# Patient Record
Sex: Male | Born: 1990 | Race: White | Hispanic: No | Marital: Single | Smoking: Former smoker
Health system: Southern US, Community
[De-identification: ages and names within clinical notes are randomized; demographics above are authoritative.]

## PROBLEM LIST (undated history)

## (undated) HISTORY — PX: TYMPANOSTOMY TUBE PLACEMENT: SHX32

---
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
## 2012-02-01 ENCOUNTER — Emergency Department: Payer: Self-pay | Admitting: Emergency Medicine

## 2014-10-07 ENCOUNTER — Emergency Department: Payer: Self-pay | Admitting: Emergency Medicine

## 2016-02-29 ENCOUNTER — Emergency Department: Payer: Self-pay

## 2016-02-29 ENCOUNTER — Encounter: Payer: Self-pay | Admitting: Emergency Medicine

## 2016-02-29 ENCOUNTER — Emergency Department
Admission: EM | Admit: 2016-02-29 | Discharge: 2016-02-29 | Disposition: A | Discharge status: Home / Self Care | Payer: Self-pay | Attending: Emergency Medicine | Admitting: Emergency Medicine

## 2016-02-29 DIAGNOSIS — Y999 Unspecified external cause status: Secondary | ICD-10-CM | POA: Insufficient documentation

## 2016-02-29 DIAGNOSIS — Y929 Unspecified place or not applicable: Secondary | ICD-10-CM | POA: Insufficient documentation

## 2016-02-29 DIAGNOSIS — F1290 Cannabis use, unspecified, uncomplicated: Secondary | ICD-10-CM | POA: Insufficient documentation

## 2016-02-29 DIAGNOSIS — X501XXA Overexertion from prolonged static or awkward postures, initial encounter: Secondary | ICD-10-CM | POA: Insufficient documentation

## 2016-02-29 DIAGNOSIS — Z87891 Personal history of nicotine dependence: Secondary | ICD-10-CM | POA: Insufficient documentation

## 2016-02-29 DIAGNOSIS — Y9389 Activity, other specified: Secondary | ICD-10-CM | POA: Insufficient documentation

## 2016-02-29 DIAGNOSIS — S93402A Sprain of unspecified ligament of left ankle, initial encounter: Secondary | ICD-10-CM | POA: Insufficient documentation

## 2016-02-29 MED ORDER — IBUPROFEN 600 MG PO TABS: 600.0000 mg | ORAL_TABLET | Freq: Four times a day (QID) | ORAL | 0 refills | Status: DC | PRN

## 2016-02-29 NOTE — ED Notes (Signed)
Wrapped pt left ankle with ace wrap - educated on usage of crutches and fitted for pt

## 2016-02-29 NOTE — ED Provider Notes (Signed)
[facility] Emergency Department Provider Note  ____________________________________________  Time seen: Approximately 7:59 PM  I have reviewed the triage vital signs and the nursing notes.   HISTORY  Chief Complaint Ankle Pain    HPI Nathaniel Rowe is a 25 y.o. male , NAD, presents to emergency department with several hour history of left ankle pain and swelling. States he stepped off a ladder and rolled his left ankle and heard a pop. States that swelling and bruising about the area began immediately. Has not been able to bear weight on the left ankle due to pain. Denies any other injuries or traumas in the past to this ankle. Has not noted any open wounds or lacerations. Denies visual changes, chest pain, shortness of breath to calls the injury and states it was mechanical in nature. Denies any numbness, weakness, tingling to the ankle, foot, toes.   History reviewed. No pertinent past medical history.  There are no active problems to display for this patient.   History reviewed. No pertinent surgical history.  Prior to Admission medications   Medication Sig Start Date End Date Taking? Authorizing Provider  ibuprofen (ADVIL,MOTRIN) 600 MG tablet Take 1 tablet (600 mg total) by mouth every 6 (six) hours as needed. 02/29/16   Jami L Hagler, PA-C    Allergies Review of patient's allergies indicates no known allergies.  No family history on file.  Social History Social History  Substance Use Topics  . Smoking status: Former Smoker    Types: Cigarettes    Quit date: 02/25/2016  . Smokeless tobacco: Not on file  . Alcohol use No     Review of Systems  Constitutional: No fever/chills Eyes: No visual changes.  Cardiovascular: No chest pain. Respiratory: No shortness of breath.  Musculoskeletal: Positive left ankle pain. Negative for left lower leg, foot, toe.  Skin: Positive left ankle swelling and bruising. Negative for rash, redness, warmth,  open wounds or lacerations. Neurological: Negative for headaches, focal weakness or numbness. No tingling 10-point ROS otherwise negative.  ____________________________________________   PHYSICAL EXAM:  VITAL SIGNS: ED Triage Vitals  Enc Vitals Group     BP 02/29/16 1854 (!) 145/67     Pulse Rate 02/29/16 1854 (!) 103     Resp 02/29/16 1854 16     Temp 02/29/16 1854 98.5 F (36.9 C)     Temp Source 02/29/16 1854 Oral     SpO2 02/29/16 1854 98 %     Weight 02/29/16 1855 225 lb (102.1 kg)     Height 02/29/16 1855  (1.88 m)     Head Circumference --      Peak Flow --      Pain Score 02/29/16 1908 10     Pain Loc --      Pain Edu? --      Excl. in GC? --      Constitutional: Alert and oriented. Well appearing and in no acute distress. Eyes: Conjunctivae are normal.  Head: Atraumatic. Cardiovascular: Good peripheral circulation with 2+ pulses noted in the left lower extremity. Capillary refill is brisk in all digits of the left foot. Respiratory: Normal respiratory effort without tachypnea or retractions.  Musculoskeletal: Tenderness to palpation about the left lateral ankle. No laxity with anterior or posterior 4. No laxity with varus or valgus stress. Full range of motion of all digits of the left foot noted. No lower extremity tenderness nor edema.  No joint effusions. Neurologic:  Normal speech and language. No  gross focal neurologic deficits are appreciated.  Skin:  Lateral left ankle with moderate swelling and blue ecchymosis. No open wounds or lesions. Skin is warm, dry and intact. No rash noted. Psychiatric: Mood and affect are normal. Speech and behavior are normal. Patient exhibits appropriate insight and judgement.   ____________________________________________   LABS  None ____________________________________________  EKG  None ____________________________________________  RADIOLOGY I have personally viewed and evaluated these images (plain  radiographs) as part of my medical decision making, as well as reviewing the written report by the radiologist.  Dg Ankle Complete Left  Result Date: 02/29/2016 CLINICAL DATA:  Twisting injury left ankle getting off a ladder today. The patient heard a pop. Initial encounter. EXAM: LEFT ANKLE COMPLETE - 3+ VIEW COMPARISON:  None. FINDINGS: There is some soft tissue swelling about the lateral malleolus. No underlying fracture. Small tibiotalar joint effusion is identified. IMPRESSION: Lateral soft tissue swelling without underlying fracture. Electronically Signed   By: Drusilla Kannerhomas  Dalessio M.D.   On: 02/29/2016 19:38    ____________________________________________    PROCEDURES  Procedure(s) performed: None   Procedures   Medications - No data to display   ____________________________________________   INITIAL IMPRESSION / ASSESSMENT AND PLAN / ED COURSE  Pertinent labs & imaging results that were available during my care of the patient were reviewed by me and considered in my medical decision making (see chart for details).  Clinical Course    Patient's diagnosis is consistent with Left ankle sprain. Patient was placed in an Ace wrap and given crutches for supportive care. Patient given a work note to restrict weightbearing on the left foot for one week but then may return to work without restrictions. Patient will be discharged home with prescriptions for ibuprofen to take as directed. Patient should apply ice to the affected area 20 minutes 3-4 times daily and keep it elevated when not ambulating. Patient is to follow up with Dr. Rosita KeaMenz in orthopedics in 1 week if symptoms persist past this treatment course. Patient is given ED precautions to return to the ED for any worsening or new symptoms.    ____________________________________________  FINAL CLINICAL IMPRESSION(S) / ED DIAGNOSES  Final diagnoses:  Left ankle sprain, initial encounter      NEW MEDICATIONS STARTED DURING  THIS VISIT:  Discharge Medication List as of 02/29/2016  8:02 PM    START taking these medications   Details  ibuprofen (ADVIL,MOTRIN) 600 MG tablet Take 1 tablet (600 mg total) by mouth every 6 (six) hours as needed., Starting Thu 02/29/2016, Print             Hope PigeonJami L Hagler, PA-C 02/29/16 2050    Jeanmarie PlantJames A McShane, MD 02/29/16 2215

## 2016-02-29 NOTE — ED Notes (Addendum)
Pt reports he "rolled" his left ankle at 530pm - pt is not able to bear weight on ankle - outer ankle is swollen - pedal pulses present - pt took 2 tylenol at 535pm and immediately applied ice to the area

## 2016-02-29 NOTE — ED Triage Notes (Signed)
Pt presents to ED with painful left ankle after he stepped off a ladder and rolled his ankle. Heard a pop. Swelling noted.

## 2016-03-18 ENCOUNTER — Emergency Department
Admission: EM | Admit: 2016-03-18 | Discharge: 2016-03-18 | Disposition: A | Discharge status: Home / Self Care | Payer: Self-pay | Attending: Emergency Medicine | Admitting: Emergency Medicine

## 2016-03-18 ENCOUNTER — Emergency Department: Payer: Self-pay

## 2016-03-18 DIAGNOSIS — Z87891 Personal history of nicotine dependence: Secondary | ICD-10-CM | POA: Insufficient documentation

## 2016-03-18 DIAGNOSIS — N200 Calculus of kidney: Secondary | ICD-10-CM | POA: Insufficient documentation

## 2016-03-18 LAB — URINALYSIS COMPLETE WITH MICROSCOPIC (ARMC ONLY)
BILIRUBIN URINE: NEGATIVE
Bacteria, UA: NONE SEEN
GLUCOSE, UA: NEGATIVE mg/dL
KETONES UR: NEGATIVE mg/dL
LEUKOCYTES UA: NEGATIVE
NITRITE: NEGATIVE
PH: 6 (ref 5.0–8.0)
Protein, ur: 30 mg/dL — AB
SQUAMOUS EPITHELIAL / LPF: NONE SEEN
Specific Gravity, Urine: 1.019 (ref 1.005–1.030)

## 2016-03-18 LAB — CBC
HEMATOCRIT: 49.8 % (ref 40.0–52.0)
HEMOGLOBIN: 17.5 g/dL (ref 13.0–18.0)
MCH: 30.9 pg (ref 26.0–34.0)
MCHC: 35.2 g/dL (ref 32.0–36.0)
MCV: 87.7 fL (ref 80.0–100.0)
Platelets: 239 10*3/uL (ref 150–440)
RBC: 5.68 MIL/uL (ref 4.40–5.90)
RDW: 12.8 % (ref 11.5–14.5)
WBC: 6.9 10*3/uL (ref 3.8–10.6)

## 2016-03-18 LAB — BASIC METABOLIC PANEL
ANION GAP: 8 (ref 5–15)
BUN: 19 mg/dL (ref 6–20)
CALCIUM: 9.5 mg/dL (ref 8.9–10.3)
CHLORIDE: 107 mmol/L (ref 101–111)
CO2: 25 mmol/L (ref 22–32)
Creatinine, Ser: 1.16 mg/dL (ref 0.61–1.24)
GFR calc Af Amer: >60 mL/min (ref >60)
GFR calc non Af Amer: >60 mL/min (ref >60)
GLUCOSE: 110 mg/dL — AB (ref 65–99)
POTASSIUM: 4 mmol/L (ref 3.5–5.1)
Sodium: 140 mmol/L (ref 135–145)

## 2016-03-18 MED ORDER — KETOROLAC TROMETHAMINE 30 MG/ML IJ SOLN
30.0000 mg | Freq: Once | INTRAMUSCULAR | Status: AC
  Administered 2016-03-18: 30 mg via INTRAVENOUS
  Filled 2016-03-18: qty 1

## 2016-03-18 MED ORDER — TAMSULOSIN HCL 0.4 MG PO CAPS: 0.4000 mg | ORAL_CAPSULE | Freq: Every day | ORAL | 0 refills | Status: AC

## 2016-03-18 MED ORDER — OXYCODONE-ACETAMINOPHEN 5-325 MG PO TABS
2.0000 | ORAL_TABLET | Freq: Once | ORAL | Status: AC
  Administered 2016-03-18: 2 via ORAL

## 2016-03-18 MED ORDER — SODIUM CHLORIDE 0.9 % IV BOLUS (SEPSIS)
1000.0000 mL | Freq: Once | INTRAVENOUS | Status: AC
  Administered 2016-03-18: 1000 mL via INTRAVENOUS

## 2016-03-18 MED ORDER — FENTANYL CITRATE (PF) 100 MCG/2ML IJ SOLN
INTRAMUSCULAR | Status: AC
  Administered 2016-03-18: 50 ug via NASAL
  Filled 2016-03-18: qty 2

## 2016-03-18 MED ORDER — IBUPROFEN 800 MG PO TABS: 800.0000 mg | ORAL_TABLET | Freq: Three times a day (TID) | ORAL | 0 refills | Status: DC | PRN

## 2016-03-18 MED ORDER — OXYCODONE-ACETAMINOPHEN 5-325 MG PO TABS
ORAL_TABLET | ORAL | Status: AC
  Administered 2016-03-18: 2 via ORAL
  Filled 2016-03-18: qty 2

## 2016-03-18 MED ORDER — ONDANSETRON HCL 4 MG/2ML IJ SOLN
4.0000 mg | Freq: Once | INTRAMUSCULAR | Status: AC
  Administered 2016-03-18: 4 mg via INTRAVENOUS
  Filled 2016-03-18: qty 2

## 2016-03-18 MED ORDER — ONDANSETRON HCL 4 MG/2ML IJ SOLN
INTRAMUSCULAR | Status: AC
  Administered 2016-03-18: 4 mg via INTRAVENOUS
  Filled 2016-03-18: qty 2

## 2016-03-18 MED ORDER — ONDANSETRON HCL 4 MG PO TABS: 4.0000 mg | ORAL_TABLET | Freq: Three times a day (TID) | ORAL | 1 refills | Status: DC | PRN

## 2016-03-18 MED ORDER — OXYCODONE-ACETAMINOPHEN 5-325 MG PO TABS: 2.0000 | ORAL_TABLET | Freq: Four times a day (QID) | ORAL | 0 refills | Status: DC | PRN

## 2016-03-18 MED ORDER — FENTANYL CITRATE (PF) 100 MCG/2ML IJ SOLN
50.0000 ug | INTRAMUSCULAR | Status: DC | PRN
Start: 2016-03-18 — End: 2016-03-18
  Administered 2016-03-18: 50 ug via NASAL

## 2016-03-18 MED ORDER — ONDANSETRON HCL 4 MG/2ML IJ SOLN
4.0000 mg | Freq: Once | INTRAMUSCULAR | Status: AC
  Administered 2016-03-18: 4 mg via INTRAVENOUS

## 2016-03-18 NOTE — ED Triage Notes (Signed)
Pt c/o sudden onset left flank pain with N/V diaphoresis that started this morning.. Denies a hx of kidney stones.

## 2016-03-18 NOTE — ED Provider Notes (Addendum)
[facility]-EMERGENCY DEPARTMENT Provider Note   CSN: [id number] Arrival date & time: 03/18/16  1478     History   Chief Complaint Chief Complaint  Patient presents with  . Flank Pain    HPI Nathaniel Rowe is a 25 y.o. male otherwise healthy here with L flank pain. Acute onset of left flank pain that is sharp and radiates to the left groin this morning. Associated with some diaphoresis and vomiting. Denies any fevers or chills. States that his urine appears a little dark but denies any obvious hematuria or dysuria. He states that he has no history of kidney stones or family history of kidney stones. Denies any testicular pain.  The history is provided by the patient.    History reviewed. No pertinent past medical history.  There are no active problems to display for this patient.   History reviewed. No pertinent surgical history.     Home Medications    Prior to Admission medications   Medication Sig Start Date End Date Taking? Authorizing Provider  ibuprofen (ADVIL,MOTRIN) 600 MG tablet Take 1 tablet (600 mg total) by mouth every 6 (six) hours as needed. 02/29/16   Jami L Hagler, PA-C    Family History No family history on file.  Social History Social History  Substance Use Topics  . Smoking status: Former Smoker    Types: Cigarettes    Quit date: 02/25/2016  . Smokeless tobacco: Never Used  . Alcohol use No     Allergies   Review of patient's allergies indicates no known allergies.   Review of Systems Review of Systems  Genitourinary: Positive for flank pain.  All other systems reviewed and are negative.    Physical Exam Updated Vital Signs BP 138/73 (BP Location: Left Arm)   Pulse 60   Temp 97.7 F (36.5 C) (Oral)   Resp 17   Ht 6\' 2"  (1.88 m)   Wt 225 lb (102.1 kg)   SpO2 99%   BMI 28.89 kg/m   Physical Exam  Constitutional: He is oriented to person, place, and time.  Uncomfortable   HENT:  Head: Normocephalic.  MM slightly dry   Eyes:  EOM are normal. Pupils are equal, round, and reactive to light.  Neck: Normal range of motion. Neck supple.  Cardiovascular: Normal rate, regular rhythm and normal heart sounds.   Pulmonary/Chest: Effort normal and breath sounds normal.  Abdominal: Soft. Bowel sounds are normal.  Mild LLQ tenderness, no rebound   Genitourinary:  Genitourinary Comments: Testicles nontender. Good cremasteric reflex bilaterally   Musculoskeletal: Normal range of motion.  Neurological: He is alert and oriented to person, place, and time.  Skin: Skin is warm.  Psychiatric: He has a normal mood and affect.  Nursing note and vitals reviewed.    ED Treatments / Results  Labs (all labs ordered are listed, but only abnormal results are displayed) Labs Reviewed  URINALYSIS COMPLETEWITH MICROSCOPIC ([facility] ONLY) - Abnormal; Notable for the following:       Result Value   Color, Urine YELLOW (*)    APPearance CLEAR (*)    Hgb urine dipstick 3+ (*)    Protein, ur 30 (*)    All other components within normal limits  BASIC METABOLIC PANEL - Abnormal; Notable for the following:    Glucose, Bld 110 (*)    All other components within normal limits  CBC    EKG  EKG Interpretation None       Radiology Ct Renal Stone Study  Result  Date: 03/18/2016 CLINICAL DATA:  Left flank pain.  Nausea and vomiting.  Diaphoresis. EXAM: CT ABDOMEN AND PELVIS WITHOUT CONTRAST TECHNIQUE: Multidetector CT imaging of the abdomen and pelvis was performed following the standard protocol without IV contrast. COMPARISON:  None. FINDINGS: Lower chest: No significant pulmonary nodules or acute consolidative airspace disease. Hepatobiliary: Normal liver with no liver mass. Normal gallbladder with no radiopaque cholelithiasis. No biliary ductal dilatation. Pancreas: Normal, with no mass or duct dilation. Spleen: Normal size. No mass. Adrenals/Urinary Tract: Normal adrenals. No right renal stones. No right hydronephrosis. Normal caliber  right ureter, with no right ureteral stones. Obstructing 3 mm stone at the left ureterovesical junction with mild left hydroureteronephrosis. There is fat stranding surrounding the central left kidney/collecting system. Punctate nonobstructing 1 mm stone in the interpolar left kidney. No additional left renal stones. No contour deforming renal mass. Collapsed and grossly normal bladder. Stomach/Bowel: Grossly normal stomach. Normal caliber small bowel with no small bowel wall thickening. Normal appendix. Normal large bowel with no diverticulosis, large bowel wall thickening or pericolonic fat stranding. Vascular/Lymphatic: Normal caliber abdominal aorta. No pathologically enlarged lymph nodes in the abdomen or pelvis. Reproductive: Normal size prostate. Other: No pneumoperitoneum, ascites or focal fluid collection. Musculoskeletal: No aggressive appearing focal osseous lesions. Minimal lumbar spondylosis. IMPRESSION: 1. Obstructing 3 mm stone at the left ureterovesical junction with mild left hydroureteronephrosis. 2. Additional punctate nonobstructing left renal stone. 3. Otherwise normal CT of the abdomen and pelvis. Electronically Signed   By: Delbert PhenixJason A Poff M.D.   On: 03/18/2016 12:23    Procedures Procedures (including critical care time)  Medications Ordered in ED Medications  fentaNYL (SUBLIMAZE) injection 50 mcg (50 mcg Nasal Given 03/18/16 0956)  sodium chloride 0.9 % bolus 1,000 mL (0 mLs Intravenous Stopped 03/18/16 1243)  ondansetron (ZOFRAN) injection 4 mg (4 mg Intravenous Given 03/18/16 0956)  ketorolac (TORADOL) 30 MG/ML injection 30 mg (30 mg Intravenous Given 03/18/16 1036)  sodium chloride 0.9 % bolus 1,000 mL (1,000 mLs Intravenous New Bag/Given 03/18/16 1242)  ondansetron (ZOFRAN) injection 4 mg (4 mg Intravenous Given 03/18/16 1241)  oxyCODONE-acetaminophen (PERCOCET/ROXICET) 5-325 MG per tablet 2 tablet (2 tablets Oral Given 03/18/16 1351)     Initial Impression / Assessment and Plan  / ED Course  I have reviewed the triage vital signs and the nursing notes.  Pertinent labs & imaging results that were available during my care of the patient were reviewed by me and considered in my medical decision making (see chart for details).  Clinical Course    Lottie RaterDustin L Thelander is a 25 y.o. male here with L flank pain. Concerned for renal colic. Testicles nontender. Will get labs, UA, CT renal stone.   12:30 pm I reassessed patient. Pain controlled but nauseated and vomited once after CT. Will give more zofran.   2 pm Tolerated PO in the ED. But patient's pain returned. Will give percocet.   2:39 PM Pain controlled, no vomiting now. WBC nl. UA + blood. CT showed 3 mm L UVJ stone with mild hydro. Will dc home with motrin, zofran, percocet, flomax, urology follow up. I checked [place] controlled substance database and he has no recent narcotics filled.    Final Clinical Impressions(s) / ED Diagnoses   Final diagnoses:  None    New Prescriptions New Prescriptions   No medications on file     Charlynne Panderavid Hsienta Kastiel Simonian, MD 03/18/16 1440    Charlynne Panderavid Hsienta Jaley Yan, MD 03/18/16 [phone]

## 2016-03-18 NOTE — Discharge Instructions (Signed)
Stay hydrated.   Take zofran for nausea.   Take motrin for pain.   Take flomax daily.   Take percocet for severe pain. Do NOT drive with it.   See urology for follow up  Return to ER if you have worse abdominal pain, vomiting, fever, dehydration.   Return to

## 2016-03-20 ENCOUNTER — Emergency Department
Admission: EM | Admit: 2016-03-20 | Discharge: 2016-03-20 | Disposition: A | Discharge status: Home / Self Care | Payer: Self-pay | Attending: Emergency Medicine | Admitting: Emergency Medicine

## 2016-03-20 ENCOUNTER — Encounter: Payer: Self-pay | Admitting: Emergency Medicine

## 2016-03-20 DIAGNOSIS — Z87891 Personal history of nicotine dependence: Secondary | ICD-10-CM | POA: Insufficient documentation

## 2016-03-20 DIAGNOSIS — F1290 Cannabis use, unspecified, uncomplicated: Secondary | ICD-10-CM | POA: Insufficient documentation

## 2016-03-20 DIAGNOSIS — N23 Unspecified renal colic: Secondary | ICD-10-CM | POA: Insufficient documentation

## 2016-03-20 DIAGNOSIS — Z791 Long term (current) use of non-steroidal anti-inflammatories (NSAID): Secondary | ICD-10-CM | POA: Insufficient documentation

## 2016-03-20 LAB — URINALYSIS COMPLETE WITH MICROSCOPIC (ARMC ONLY)
Bacteria, UA: NONE SEEN
Bilirubin Urine: NEGATIVE
Glucose, UA: NEGATIVE mg/dL
KETONES UR: NEGATIVE mg/dL
Nitrite: NEGATIVE
PH: 6 (ref 5.0–8.0)
PROTEIN: 30 mg/dL — AB
SPECIFIC GRAVITY, URINE: 1.015 (ref 1.005–1.030)

## 2016-03-20 LAB — BASIC METABOLIC PANEL
ANION GAP: 7 (ref 5–15)
BUN: 18 mg/dL (ref 6–20)
CALCIUM: 9.1 mg/dL (ref 8.9–10.3)
CO2: 27 mmol/L (ref 22–32)
Chloride: 101 mmol/L (ref 101–111)
Creatinine, Ser: 1.2 mg/dL (ref 0.61–1.24)
GFR calc Af Amer: >60 mL/min (ref >60)
GLUCOSE: 98 mg/dL (ref 65–99)
POTASSIUM: 3.8 mmol/L (ref 3.5–5.1)
SODIUM: 135 mmol/L (ref 135–145)

## 2016-03-20 LAB — CBC
HEMATOCRIT: 44.9 % (ref 40.0–52.0)
Hemoglobin: 15.9 g/dL (ref 13.0–18.0)
MCH: 31 pg (ref 26.0–34.0)
MCHC: 35.3 g/dL (ref 32.0–36.0)
MCV: 87.9 fL (ref 80.0–100.0)
PLATELETS: 199 10*3/uL (ref 150–440)
RBC: 5.11 MIL/uL (ref 4.40–5.90)
RDW: 12.8 % (ref 11.5–14.5)
WBC: 12.6 10*3/uL — AB (ref 3.8–10.6)

## 2016-03-20 MED ORDER — KETOROLAC TROMETHAMINE 10 MG PO TABS: 10.0000 mg | ORAL_TABLET | Freq: Three times a day (TID) | ORAL | 0 refills | Status: DC | PRN

## 2016-03-20 MED ORDER — KETOROLAC TROMETHAMINE 60 MG/2ML IM SOLN
INTRAMUSCULAR | Status: AC
  Filled 2016-03-20: qty 2

## 2016-03-20 MED ORDER — ONDANSETRON HCL 4 MG PO TABS: 4.0000 mg | ORAL_TABLET | Freq: Three times a day (TID) | ORAL | 1 refills | Status: AC | PRN

## 2016-03-20 MED ORDER — KETOROLAC TROMETHAMINE 60 MG/2ML IM SOLN
60.0000 mg | Freq: Once | INTRAMUSCULAR | Status: AC
  Administered 2016-03-20: 60 mg via INTRAMUSCULAR

## 2016-03-20 NOTE — ED Provider Notes (Signed)
[facility] Emergency Department Provider Note  ____________________________________________  Time seen: Approximately 4:33 PM  I have reviewed the triage vital signs and the nursing notes.   HISTORY  Chief Complaint Flank Pain    HPI Nathaniel Rowe is a 25 y.o. male with left renal stone presenting for ongoing symptoms. The patient was seen in the emergency department 03/18/16 and was diagnosed with a 3 mm renal stone at the left UVJ. At that time, he was discharged with Percocet, Zofran, and Flomax. He has been taking less Percocet than prescribed because he was concerned he would run out. He states that he is continuing to have pain, although he has no pain when he does not move. The only time he has a "sharp" pain in the left flank that radiates the left lower quadrant is with movement. He denies any hematuria, fever, nausea or vomiting but has had a decreased appetite. He states that he is drinking plenty of water and able to keep down.   History reviewed. No pertinent past medical history.  There are no active problems to display for this patient.   History reviewed. No pertinent surgical history.  Current Outpatient Rx  . Order #: [id number] Class: Print  . Order #: [id number] Class: Print  . Order #: [id number] Class: Print  . Order #: [id number] Class: Print  . Order #: [id number] Class: Print    Allergies Review of patient's allergies indicates no known allergies.  No family history on file.  Social History Social History  Substance Use Topics  . Smoking status: Former Smoker    Types: Cigarettes    Quit date: 02/25/2016  . Smokeless tobacco: Never Used  . Alcohol use No    Review of Systems Constitutional: No fever/chills. Eyes: No visual changes. ENT: No sore throat. No congestion or rhinorrhea. Cardiovascular: Denies chest pain. Denies palpitations. Respiratory: Denies shortness of breath.  No cough. Gastrointestinal:  No nausea, no  vomiting.  No diarrhea.  No constipation.Positive left flank pain radiating to the left lower quadrant. Genitourinary: Negative for dysuria. Musculoskeletal: Negative for back pain. Skin: Negative for rash. Neurological: Negative for headaches. No focal numbness, tingling or weakness.   10-point ROS otherwise negative.  ____________________________________________   PHYSICAL EXAM:  VITAL SIGNS: ED Triage Vitals  Enc Vitals Group     BP 03/20/16 1245 120/63     Pulse Rate 03/20/16 1245 97     Resp 03/20/16 1245 18     Temp 03/20/16 1245 99.3 F (37.4 C)     Temp Source 03/20/16 1245 Oral     SpO2 03/20/16 1245 99 %     Weight 03/20/16 1312 220 lb (99.8 kg)     Height 03/20/16 1312 6\' 2"  (1.88 m)     Head Circumference --      Peak Flow --      Pain Score 03/20/16 1313 10     Pain Loc --      Pain Edu? --      Excl. in GC? --     Constitutional: Alert and oriented. Well appearing and in no acute distress. Answers questions appropriately. Eyes: Conjunctivae are normal.  EOMI. No scleral icterus. Head: Atraumatic. Nose: No congestion/rhinnorhea. Mouth/Throat: Mucous membranes are Mildly dry.  Neck: No stridor.  Supple.   Cardiovascular: Normal rate, regular rhythm. No murmurs, rubs or gallops.  Respiratory: Normal respiratory effort.  No accessory muscle use or retractions. Lungs CTAB.  No wheezes, rales or ronchi. Gastrointestinal: Soft and nondistended.  Positive mild  left CVA tenderness, mild left lower quadrant tenderness to palpation. No guarding or rebound.  No peritoneal signs. Musculoskeletal: No LE edema.  Neurologic:  A&Ox3.  Speech is clear.  Face and smile are symmetric.  EOMI.  Moves all extremities well. Skin:  Skin is warm, dry and intact. No rash noted. Psychiatric: Mood and affect are normal. Speech and behavior are normal.  Normal judgement.  ____________________________________________   LABS (all labs ordered are listed, but only abnormal results are  displayed)  Labs Reviewed  URINALYSIS COMPLETEWITH MICROSCOPIC ([facility] ONLY) - Abnormal; Notable for the following:       Result Value   Color, Urine YELLOW (*)    APPearance CLEAR (*)    Hgb urine dipstick 3+ (*)    Protein, ur 30 (*)    Leukocytes, UA 1+ (*)    Squamous Epithelial / LPF 0-5 (*)    All other components within normal limits  CBC - Abnormal; Notable for the following:    WBC 12.6 (*)    All other components within normal limits  BASIC METABOLIC PANEL   ____________________________________________  EKG  Not indicated ____________________________________________  RADIOLOGY  No results found.  ____________________________________________   PROCEDURES  Procedure(s) performed: None  Procedures  Critical Care performed: No ____________________________________________   INITIAL IMPRESSION / ASSESSMENT AND PLAN / ED COURSE  Pertinent labs & imaging results that were available during my care of the patient were reviewed by me and considered in my medical decision making (see chart for details).  25 y.o. male with known left UVJ stone diagnosed 8/28 presenting with ongoing pain that is only positional, no pain at rest. Overall, the patient is well-appearing and has stable vital signs. He is afebrile here and has not had a fever at home. His examination is consistent with renal colic and his physical exam findings are mild. He has reassuring laboratory studies within normal creatinine, and no indication of infection in his urine. He did bump his white blood cell count at 12.6. Overall, the patient appears to be doing well and conservative management of his stone is indicated. He has not made a follow-up appointment with a urologist, so will give him information to call Dr. Delana Meyer office. In addition, I will provide him with a urinary strainer. At this time, I feel confident that we can transition the patient from narcotics to NSAID medications and I will give him  a prescription for Toradol tablets. He has been instructed about eating with a tablets and precautions regarding taking other simultaneous NSAIDs. Return precautions and follow-up instructions were discussed with the patient and his mother who accompanies him today.  ____________________________________________  FINAL CLINICAL IMPRESSION(S) / ED DIAGNOSES  Final diagnoses:  Renal colic on left side    Clinical Course      NEW MEDICATIONS STARTED DURING THIS VISIT:  New Prescriptions   KETOROLAC (TORADOL) 10 MG TABLET    Take 1 tablet (10 mg total) by mouth every 8 (eight) hours as needed for moderate pain (with food).      Rockne Menghini, MD 03/20/16 [phone]

## 2016-03-20 NOTE — Discharge Instructions (Signed)
Please drink plenty of fluid to stay well-hydrated and to help your kidney stone pass. Please continue to take the Flomax. You may take Tylenol for mild to moderate pain and Toradol for severe pain. Do not take other NSAID medications such as Motrin, ibuprofen, Advil, or Aleve when you're taking Toradol. Please take Toradol with food.  Please strain your urine and bring the kidney stone to your follow-up appointment with the urologist if you catch it. Please call Dr. Delana MeyerBrandon's office to make a follow-up appointment.  Return to the emergency department for severe pain, inability to keep down fluids, fever, or any other symptoms concerning to you.

## 2016-03-20 NOTE — ED Notes (Signed)
Strainer given  

## 2016-03-20 NOTE — ED Triage Notes (Signed)
Left flank pain started Monday.  Seen here in ED and diagnosed with a kidney stone.  Given Oxycodone/Acemaminophen 5/325 and Tamsulosin HCL 0.4 mg.  Patient states he is taking meds as directed with no improvement.  Here today because the pain is unrelieved.

## 2017-05-31 ENCOUNTER — Other Ambulatory Visit: Payer: Self-pay

## 2017-05-31 ENCOUNTER — Emergency Department: Payer: Self-pay

## 2017-05-31 ENCOUNTER — Emergency Department
Admission: EM | Admit: 2017-05-31 | Discharge: 2017-05-31 | Disposition: A | Discharge status: Home / Self Care | Payer: Self-pay | Attending: Emergency Medicine | Admitting: Emergency Medicine

## 2017-05-31 ENCOUNTER — Encounter: Payer: Self-pay | Admitting: Emergency Medicine

## 2017-05-31 DIAGNOSIS — F17210 Nicotine dependence, cigarettes, uncomplicated: Secondary | ICD-10-CM | POA: Insufficient documentation

## 2017-05-31 DIAGNOSIS — M25512 Pain in left shoulder: Secondary | ICD-10-CM | POA: Insufficient documentation

## 2017-05-31 DIAGNOSIS — R52 Pain, unspecified: Secondary | ICD-10-CM

## 2017-05-31 MED ORDER — CYCLOBENZAPRINE HCL 5 MG PO TABS: ORAL_TABLET | ORAL | 0 refills | Status: DC

## 2017-05-31 MED ORDER — KETOROLAC TROMETHAMINE 30 MG/ML IJ SOLN
30.0000 mg | Freq: Once | INTRAMUSCULAR | Status: AC
  Administered 2017-05-31: 30 mg via INTRAMUSCULAR
  Filled 2017-05-31: qty 1

## 2017-05-31 MED ORDER — ORPHENADRINE CITRATE 30 MG/ML IJ SOLN
60.0000 mg | Freq: Two times a day (BID) | INTRAMUSCULAR | Status: DC
  Administered 2017-05-31: 60 mg via INTRAMUSCULAR
  Filled 2017-05-31: qty 2

## 2017-05-31 MED ORDER — IBUPROFEN 800 MG PO TABS: 800.0000 mg | ORAL_TABLET | Freq: Three times a day (TID) | ORAL | 0 refills | Status: DC | PRN
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
## 2017-05-31 NOTE — ED Notes (Signed)
NAD noted at time of D/C. Pt denies questions or concerns. Pt ambulatory to the lobby at this time.  

## 2017-05-31 NOTE — ED Provider Notes (Signed)
[facility] Emergency Department Provider Note  ____________________________________________  Time seen: Approximately 4:28 PM  I have reviewed the triage vital signs and the nursing notes.   HISTORY  Chief Complaint Shoulder Pain    HPI Nathaniel Rowe is a 26 y.o. male that presents to the emergency department for evaluation of left shoulder pain for 1 week.  He states that the pain moves from the front of the shoulder to the back of his shoulder.  He describes the pain as burning in character.  Pain is worse when he moves his arm.  Shoulder is not painful currently.  Patient had an accident 6-7 years ago and injured left shoulder.  No recent trauma.  He was evaluated by a specialist one year ago and was told that he has chips in the bone.  Pain feels the same as it did at that time.  He works at a [employer] and does a lot of "pounding" with his arms.  He took a dose of ibuprofen yesterday.  He denies neck pain, shortness of breath, chest pain, numbness, tingling.  History reviewed. No pertinent past medical history.  There are no active problems to display for this patient.   History reviewed. No pertinent surgical history.  Prior to Admission medications   Medication Sig Start Date End Date Taking? Authorizing Provider  cyclobenzaprine (FLEXERIL) 5 MG tablet Take 1-2 tablets 3 times daily as needed 05/31/17   Enid DerryWagner, Audry Kauzlarich, PA-C  ibuprofen (ADVIL,MOTRIN) 800 MG tablet Take 1 tablet (800 mg total) every 8 (eight) hours as needed by mouth. 05/31/17   Enid DerryWagner, Tempestt Silba, PA-C  ketorolac (TORADOL) 10 MG tablet Take 1 tablet (10 mg total) by mouth every 8 (eight) hours as needed for moderate pain (with food). 03/20/16   Rockne MenghiniNorman, Anne-Caroline, MD  oxyCODONE-acetaminophen (PERCOCET) 5-325 MG tablet Take 2 tablets by mouth every 6 (six) hours as needed. 03/18/16   Charlynne PanderYao, David Hsienta, MD  tamsulosin (FLOMAX) 0.4 MG CAPS capsule Take 1 capsule (0.4 mg total) by mouth  daily. 03/18/16   Charlynne PanderYao, David Hsienta, MD    Allergies Patient has no known allergies.  History reviewed. No pertinent family history.  Social History Social History   Tobacco Use  . Smoking status: Current Every Day Smoker    Packs/day: 0.50    Years: 0.00    Pack years: 0.00    Types: Cigarettes    Last attempt to quit: 02/25/2016    Years since quitting: 1.2  . Smokeless tobacco: Never Used  Substance Use Topics  . Alcohol use: No  . Drug use: Yes    Types: Marijuana     Review of Systems  Cardiovascular: No chest pain. Respiratory:  No SOB. Gastrointestinal: No abdominal pain.  No nausea, no vomiting.  Musculoskeletal: Positive for shoulder pain. Skin: Negative for rash, abrasions, lacerations, ecchymosis. Neurological: Negative for headaches, numbness or tingling   ____________________________________________   PHYSICAL EXAM:  VITAL SIGNS: ED Triage Vitals  Enc Vitals Group     BP 05/31/17 1507 131/69     Pulse Rate 05/31/17 1507 99     Resp 05/31/17 1507 16     Temp 05/31/17 1507 98.8 F (37.1 C)     Temp src --      SpO2 05/31/17 1507 98 %     Weight 05/31/17 1508 230 lb (104.3 kg)     Height 05/31/17 1508 6\' 2"  (1.88 m)     Head Circumference --  Peak Flow --      Pain Score 05/31/17 1507 6     Pain Loc --      Pain Edu? --      Excl. in GC? --      Constitutional: Alert and oriented. Well appearing and in no acute distress. Eyes: Conjunctivae are normal. PERRL. EOMI. Head: Atraumatic. ENT:      Ears:      Nose: No congestion/rhinnorhea.      Mouth/Throat: Mucous membranes are moist.  Neck: No stridor. No cervical spine tenderness to palpation. Cardiovascular: Normal rate, regular rhythm.  Good peripheral circulation.  Symmetric radial pulses. Respiratory: Normal respiratory effort without tachypnea or retractions. Lungs CTAB. Good air entry to the bases with no decreased or absent breath sounds. Musculoskeletal: Full range of motion to  all extremities. No gross deformities appreciated.  No tenderness to palpation over left shoulder.  Full range of motion of shoulder.  Strength 5 out of 5 in upper extremities bilaterally. Neurologic:  Normal speech and language. No gross focal neurologic deficits are appreciated.  Skin:  Skin is warm, dry and intact. No rash noted.   ____________________________________________   LABS (all labs ordered are listed, but only abnormal results are displayed)  Labs Reviewed - No data to display ____________________________________________  EKG   ____________________________________________  RADIOLOGY Lexine BatonI, Lunden Mcleish, personally viewed and evaluated these images (plain radiographs) as part of my medical decision making, as well as reviewing the written report by the radiologist.  Dg Shoulder Left  Result Date: 05/31/2017 CLINICAL DATA:  Worsening pain without trauma. EXAM: LEFT SHOULDER - 2+ VIEW COMPARISON:  None. FINDINGS: There is no evidence of fracture or dislocation. There is no evidence of arthropathy or other focal bone abnormality. Soft tissues are unremarkable. IMPRESSION: Negative. Electronically Signed   By: Gerome Samavid  Williams III M.D   On: 05/31/2017 16:02    ____________________________________________    PROCEDURES  Procedure(s) performed:    Procedures    Medications  orphenadrine (NORFLEX) injection 60 mg (not administered)  ketorolac (TORADOL) 30 MG/ML injection 30 mg (not administered)     ____________________________________________   INITIAL IMPRESSION / ASSESSMENT AND PLAN / ED COURSE  Pertinent labs & imaging results that were available during my care of the patient were reviewed by me and considered in my medical decision making (see chart for details).  Review of the [place] CSRS was performed in accordance of the [facility] prior to dispensing any controlled drugs.   Patient presents to the emergency department for evaluation of left shoulder pain for 1  week.  Vital signs and exam are reassuring.  X-ray negative for bony abnormalities.  Pain is likely musculoskeletal.  He was given IM Norflex and Toradol in ED.  Patient will be discharged home with prescriptions for ibuprofen and Flexeril. Patient is to follow up with PCP as directed. Patient is given ED precautions to return to the ED for any worsening or new symptoms.  ____________________________________________  FINAL CLINICAL IMPRESSION(S) / ED DIAGNOSES  Final diagnoses:  Pain  Left shoulder pain, unspecified chronicity      NEW MEDICATIONS STARTED DURING THIS VISIT:      This chart was dictated using voice recognition software/Dragon. Despite best efforts to proofread, errors can occur which can change the meaning. Any change was purely unintentional.    Enid DerryWagner, Lori Popowski, PA-C 05/31/17 Radonna [id number]    Siadecki, Sebastian, MD 06/01/17 [phone]

## 2017-07-19 ENCOUNTER — Emergency Department
Admission: EM | Admit: 2017-07-19 | Discharge: 2017-07-19 | Disposition: A | Discharge status: Home / Self Care | Payer: Self-pay | Attending: Emergency Medicine | Admitting: Emergency Medicine

## 2017-07-19 ENCOUNTER — Encounter: Payer: Self-pay | Admitting: Emergency Medicine

## 2017-07-19 ENCOUNTER — Other Ambulatory Visit: Payer: Self-pay

## 2017-07-19 DIAGNOSIS — F17210 Nicotine dependence, cigarettes, uncomplicated: Secondary | ICD-10-CM | POA: Insufficient documentation

## 2017-07-19 DIAGNOSIS — Z79899 Other long term (current) drug therapy: Secondary | ICD-10-CM | POA: Insufficient documentation

## 2017-07-19 DIAGNOSIS — H6693 Otitis media, unspecified, bilateral: Secondary | ICD-10-CM | POA: Insufficient documentation

## 2017-07-19 DIAGNOSIS — H6690 Otitis media, unspecified, unspecified ear: Secondary | ICD-10-CM

## 2017-07-19 MED ORDER — AMOXICILLIN-POT CLAVULANATE 875-125 MG PO TABS: 1.0000 | ORAL_TABLET | Freq: Two times a day (BID) | ORAL | 0 refills | Status: AC

## 2017-07-19 NOTE — ED Triage Notes (Signed)
Bilateral ear pain since getting off plane in [place] on 12/27.

## 2017-07-19 NOTE — ED Provider Notes (Signed)
[facility] Emergency Department Provider Note  ____________________________________________  Time seen: Approximately 7:06 PM  I have reviewed the triage vital signs and the nursing notes.   HISTORY  Chief Complaint Otalgia    HPI Nathaniel Rowe is a 26 y.o. male that presents emergency department for evaluation of bilateral ear pain [place] and back here 2 days ago.  There is a significant amount of pain and pressure in both ears.  Patient has tried plugging his nose and blowing, gum, coughing without relief.  No recent illness.  He had tubes in his years as a child.  No fever, nasal congestion, shortness of breath, chest pain.   History reviewed. No pertinent past medical history.  There are no active problems to display for this patient.   History reviewed. No pertinent surgical history.  Prior to Admission medications   Medication Sig Start Date End Date Taking? Authorizing Provider  amoxicillin-clavulanate (AUGMENTIN) 875-125 MG tablet Take 1 tablet by mouth 2 (two) times daily for 10 days. 07/19/17 07/29/17  Enid DerryWagner, Casilda Pickerill, PA-C  cyclobenzaprine (FLEXERIL) 5 MG tablet Take 1-2 tablets 3 times daily as needed 05/31/17   Enid DerryWagner, Maisen Klingler, PA-C  ibuprofen (ADVIL,MOTRIN) 800 MG tablet Take 1 tablet (800 mg total) every 8 (eight) hours as needed by mouth. 05/31/17   Enid DerryWagner, Cheney Gosch, PA-C  ketorolac (TORADOL) 10 MG tablet Take 1 tablet (10 mg total) by mouth every 8 (eight) hours as needed for moderate pain (with food). 03/20/16   Rockne MenghiniNorman, Anne-Caroline, MD  oxyCODONE-acetaminophen (PERCOCET) 5-325 MG tablet Take 2 tablets by mouth every 6 (six) hours as needed. 03/18/16   Charlynne PanderYao, David Hsienta, MD  tamsulosin (FLOMAX) 0.4 MG CAPS capsule Take 1 capsule (0.4 mg total) by mouth daily. 03/18/16   Charlynne PanderYao, David Hsienta, MD    Allergies Patient has no known allergies.  No family history on file.  Social History Social History   Tobacco  Use  . Smoking status: Current Every Day Smoker    Packs/day: 1.00    Years: 0.00    Pack years: 0.00    Types: Cigarettes    Last attempt to quit: 02/25/2016    Years since quitting: 1.3  . Smokeless tobacco: Never Used  Substance Use Topics  . Alcohol use: No  . Drug use: Yes    Types: Marijuana     Review of Systems  Constitutional: No fever/chills Eyes: No visual changes. No discharge. ENT: Negative for congestion and rhinorrhea. Cardiovascular: No chest pain. Respiratory: Negative for cough. No SOB. Musculoskeletal: Negative for musculoskeletal pain. Skin: Negative for rash, abrasions, lacerations, ecchymosis. Neurological: Negative for headaches.   ____________________________________________   PHYSICAL EXAM:  VITAL SIGNS: ED Triage Vitals  Enc Vitals Group     BP 07/19/17 1700 (!) 150/99     Pulse Rate 07/19/17 1700 95     Resp 07/19/17 1700 18     Temp 07/19/17 1700 98 F (36.7 C)     Temp Source 07/19/17 1700 Oral     SpO2 07/19/17 1700 98 %     Weight 07/19/17 1701 220 lb (99.8 kg)     Height 07/19/17 1701 6\' 2"  (1.88 m)     Head Circumference --      Peak Flow --      Pain Score 07/19/17 1700 10     Pain Loc --      Pain Edu? --      Excl. in GC? --  Constitutional: Alert and oriented. Well appearing and in no acute distress. Eyes: Conjunctivae are normal. PERRL. EOMI. No discharge. Head: Atraumatic. ENT: No frontal and maxillary sinus tenderness.      Ears: Tympanic membranes erythematous and bulging bilaterally. No discharge.  No tenderness to palpation of pinna or tragus.  No mastoid tenderness.      Nose: Mild congestion/rhinnorhea.      Mouth/Throat: Mucous membranes are moist. Oropharynx non-erythematous. Tonsils not enlarged. No exudates. Uvula midline. Neck: No stridor.   Hematological/Lymphatic/Immunilogical: No cervical lymphadenopathy. Cardiovascular: Normal rate, regular rhythm.  Good peripheral circulation. Respiratory: Normal  respiratory effort without tachypnea or retractions. Lungs CTAB. Good air entry to the bases with no decreased or absent breath sounds. Gastrointestinal: Bowel sounds 4 quadrants. Soft and nontender to palpation. No guarding or rigidity. No palpable masses. No distention. Musculoskeletal: Full range of motion to all extremities. No gross deformities appreciated. Neurologic:  Normal speech and language. No gross focal neurologic deficits are appreciated.  Skin:  Skin is warm, dry and intact. No rash noted.   ____________________________________________   LABS (all labs ordered are listed, but only abnormal results are displayed)  Labs Reviewed - No data to display ____________________________________________  EKG   ____________________________________________  RADIOLOGY  No results found.  ____________________________________________    PROCEDURES  Procedure(s) performed:    Procedures    Medications - No data to display   ____________________________________________   INITIAL IMPRESSION / ASSESSMENT AND PLAN / ED COURSE  Pertinent labs & imaging results that were available during my care of the patient were reviewed by me and considered in my medical decision making (see chart for details).  Review of the  CSRS was performed in accordance of the [facility] prior to dispensing any controlled drugs.   Patient's diagnosis is consistent with otitis media. Vital signs and exam are reassuring. Patient feels comfortable going home. Patient will be discharged home with prescriptions for Augmentin. Patient is to follow up with PCP as needed or otherwise directed. Patient is given ED precautions to return to the ED for any worsening or new symptoms.   ____________________________________________  FINAL CLINICAL IMPRESSION(S) / ED DIAGNOSES  Final diagnoses:  Acute otitis media, unspecified otitis media type      NEW MEDICATIONS STARTED DURING THIS VISIT:  ED  Discharge Orders        Ordered    amoxicillin-clavulanate (AUGMENTIN) 875-125 MG tablet  2 times daily     07/19/17 1813          This chart was dictated using voice recognition software/Dragon. Despite best efforts to proofread, errors can occur which can change the meaning. Any change was purely unintentional.    Enid DerryWagner, Duncan Alejandro, PA-C 07/19/17 1908    Jeanmarie PlantMcShane, James A, MD 07/19/17 [phone]

## 2017-11-02 ENCOUNTER — Encounter: Payer: Self-pay | Admitting: Emergency Medicine

## 2017-11-02 ENCOUNTER — Emergency Department
Admission: EM | Admit: 2017-11-02 | Discharge: 2017-11-02 | Disposition: A | Discharge status: Home / Self Care | Payer: Self-pay | Attending: Emergency Medicine | Admitting: Emergency Medicine

## 2017-11-02 DIAGNOSIS — F17210 Nicotine dependence, cigarettes, uncomplicated: Secondary | ICD-10-CM | POA: Insufficient documentation

## 2017-11-02 DIAGNOSIS — M7918 Myalgia, other site: Secondary | ICD-10-CM | POA: Insufficient documentation

## 2017-11-02 DIAGNOSIS — M542 Cervicalgia: Secondary | ICD-10-CM | POA: Insufficient documentation

## 2017-11-02 MED ORDER — METHOCARBAMOL 500 MG PO TABS: 500.0000 mg | ORAL_TABLET | Freq: Four times a day (QID) | ORAL | 0 refills | Status: AC

## 2017-11-02 MED ORDER — IBUPROFEN 600 MG PO TABS: 600.0000 mg | ORAL_TABLET | Freq: Three times a day (TID) | ORAL | 0 refills | Status: AC | PRN

## 2017-11-02 MED ORDER — IBUPROFEN 600 MG PO TABS
600.0000 mg | ORAL_TABLET | Freq: Once | ORAL | Status: AC
  Administered 2017-11-02: 600 mg via ORAL
  Filled 2017-11-02: qty 1

## 2017-11-02 MED ORDER — METHOCARBAMOL 500 MG PO TABS
1000.0000 mg | ORAL_TABLET | Freq: Once | ORAL | Status: AC
  Administered 2017-11-02: 1000 mg via ORAL
  Filled 2017-11-02: qty 2

## 2017-11-02 NOTE — ED Notes (Signed)
Pt complains of right sided neck pain, described as a tightness. Pt states he switched pillows a couple days ago before the discomfort started. Pt still maintains full ROM.

## 2017-11-02 NOTE — Discharge Instructions (Addendum)
Follow-up with your primary care provider if any continued problems or concerns.  Use ice or heat to your neck and shoulder as needed for discomfort.  Take ibuprofen and Robaxin as needed for your shoulder neck pain.  Do not take the Robaxin while driving or operating machinery.  You may continue taking ibuprofen every 8 hours with food.

## 2017-11-02 NOTE — ED Notes (Signed)

## 2017-11-02 NOTE — ED Triage Notes (Signed)
Patient presents to the ED with neck, "popping" x 2 days.  Patient states, "it pops no matter which way I turn it."  Patient is in no obvious distress at this time.

## 2017-11-02 NOTE — ED Provider Notes (Signed)
[facility] Emergency Department Provider Note  ____________________________________________   First MD Initiated Contact with Patient 11/02/17 1627     (approximate)  I have reviewed the triage vital signs and the nursing notes.   HISTORY  Chief Complaint Neck Pain  HPI Nathaniel Rowe is a 27 y.o. male is here with complaint of neck pain for the last 2 days.  Patient states that when he moves his neck he hears a "pop".  He also feels stiffness and soreness into his left shoulder.  He states there was no injury.  He states that he slept on a pillow and noticed it the next morning.  Patient has not taken any over-the-counter medication.  He denies any paresthesias into his left arm or decreased grip strength.  Rates his pain as 4 out of 10. History reviewed. No pertinent past medical history.  There are no active problems to display for this patient.   History reviewed. No pertinent surgical history.  Prior to Admission medications   Medication Sig Start Date End Date Taking? Authorizing Provider  ibuprofen (ADVIL,MOTRIN) 600 MG tablet Take 1 tablet (600 mg total) by mouth every 8 (eight) hours as needed. 11/02/17   Tommi RumpsSummers, Anetta Olvera L, PA-C  methocarbamol (ROBAXIN) 500 MG tablet Take 1 tablet (500 mg total) by mouth 4 (four) times daily. 11/02/17   Tommi RumpsSummers, Lelia Jons L, PA-C  tamsulosin (FLOMAX) 0.4 MG CAPS capsule Take 1 capsule (0.4 mg total) by mouth daily. 03/18/16   Charlynne PanderYao, David Hsienta, MD    Allergies Patient has no known allergies.  No family history on file.  Social History Social History   Tobacco Use  . Smoking status: Current Every Day Smoker    Packs/day: 1.00    Years: 0.00    Pack years: 0.00    Types: Cigarettes    Last attempt to quit: 02/25/2016    Years since quitting: 1.6  . Smokeless tobacco: Never Used  Substance Use Topics  . Alcohol use: No  . Drug use: Yes    Types: Marijuana    Review of Systems Constitutional: No  fever/chills Cardiovascular: Denies chest pain. Respiratory: Denies shortness of breath. Musculoskeletal: Positive for cervical discomfort and left shoulder pain. Skin: Negative for rash. Neurological: Negative for  focal weakness or numbness. ____________________________________________   PHYSICAL EXAM:  VITAL SIGNS: ED Triage Vitals  Enc Vitals Group     BP 11/02/17 1555 (!) 143/73     Pulse Rate 11/02/17 1555 71     Resp 11/02/17 1555 18     Temp 11/02/17 1555 98.4 F (36.9 C)     Temp Source 11/02/17 1555 Oral     SpO2 11/02/17 1555 98 %     Weight 11/02/17 1551 230 lb (104.3 kg)     Height 11/02/17 1551 6\' 2"  (1.88 m)     Head Circumference --      Peak Flow --      Pain Score 11/02/17 1551 4     Pain Loc --      Pain Edu? --      Excl. in GC? --     Constitutional: Alert and oriented. Well appearing and in no acute distress. Eyes: Conjunctivae are normal. PERRL. EOMI. Head: Atraumatic. Neck: No stridor.  No point tenderness on palpation of cervical spine posteriorly.  There is some minimal soft tissue tenderness noted to the left cervical muscles and trapezius.  There is no gross deformity or soft tissue swelling.  There is  no evidence of injury.  Range of motion is without restriction in all 4 planes.  No crepitus is noted with range of motion of the left shoulder.  Nontender scapula.  Muscles are moderately tender and tight in comparison with the right. Hematological/Lymphatic/Immunilogical: No cervical lymphadenopathy. Cardiovascular: Normal rate, regular rhythm. Grossly normal heart sounds.  Good peripheral circulation. Respiratory: Normal respiratory effort.  No retractions. Lungs CTAB. Gastrointestinal: Soft and nontender. No distention. No abdominal bruits. No CVA tenderness. Musculoskeletal: Moves upper and lower extremities without any difficulty and normal gait was noted.  See cervical exam above.  Grip strength was equal bilaterally. Neurologic:  Normal speech  and language. No gross focal neurologic deficits are appreciated. No gait instability. Skin:  Skin is warm, dry and intact.  No ecchymosis, erythema or abrasions were noted. Psychiatric: Mood and affect are normal. Speech and behavior are normal.  ____________________________________________   LABS (all labs ordered are listed, but only abnormal results are displayed)  Labs Reviewed - No data to display  RADIOLOGY Deferred.  PROCEDURES  Procedure(s) performed: None  Procedures  Critical Care performed: No  ____________________________________________   INITIAL IMPRESSION / ASSESSMENT AND PLAN / ED COURSE Patient presents with cervical discomfort for 2 days.  There is no history of injury.  Patient exam shows muscle tenderness only on exam.  Because he is not driving patient was given methocarbamol and ibuprofen while in the department.  He was discharged with prescription for the same.  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  He is also encouraged to use ice or heat to his neck as needed and follow-up with his PCP if any continued problems.  ____________________________________________   FINAL CLINICAL IMPRESSION(S) / ED DIAGNOSES  Final diagnoses:  Musculoskeletal pain     ED Discharge Orders        Ordered    ibuprofen (ADVIL,MOTRIN) 600 MG tablet  Every 8 hours PRN     11/02/17 1710    methocarbamol (ROBAXIN) 500 MG tablet  4 times daily     11/02/17 1710       Note:  This document was prepared using Dragon voice recognition software and may include unintentional dictation errors.    Tommi Rumps, PA-C 11/02/17 1738    Jene Every, MD 11/02/17 [phone]

## 2018-02-12 ENCOUNTER — Emergency Department: Payer: Self-pay

## 2018-02-12 ENCOUNTER — Emergency Department
Admission: EM | Admit: 2018-02-12 | Discharge: 2018-02-12 | Disposition: A | Discharge status: Home / Self Care | Payer: Self-pay | Attending: Emergency Medicine | Admitting: Emergency Medicine

## 2018-02-12 ENCOUNTER — Other Ambulatory Visit: Payer: Self-pay

## 2018-02-12 ENCOUNTER — Encounter: Payer: Self-pay | Admitting: Emergency Medicine

## 2018-02-12 DIAGNOSIS — F17210 Nicotine dependence, cigarettes, uncomplicated: Secondary | ICD-10-CM | POA: Insufficient documentation

## 2018-02-12 DIAGNOSIS — Z79899 Other long term (current) drug therapy: Secondary | ICD-10-CM | POA: Insufficient documentation

## 2018-02-12 DIAGNOSIS — M25512 Pain in left shoulder: Secondary | ICD-10-CM | POA: Insufficient documentation

## 2018-02-12 MED ORDER — CYCLOBENZAPRINE HCL 10 MG PO TABS: 10.0000 mg | ORAL_TABLET | Freq: Three times a day (TID) | ORAL | 0 refills | Status: DC | PRN

## 2018-02-12 MED ORDER — NAPROXEN 500 MG PO TABS: 500.0000 mg | ORAL_TABLET | Freq: Two times a day (BID) | ORAL | Status: DC

## 2018-02-12 NOTE — ED Notes (Signed)
Left shoulder pain upon waking this am.  [facility] [employer].  Says has had problems with same shoulder couple years ago.  No injury.  Has it in a sling.

## 2018-02-12 NOTE — ED Notes (Signed)
Returned from xray

## 2018-02-12 NOTE — Discharge Instructions (Signed)
Follow discharge care instruction.  Pick up prescription from pharmacy.  Consider orthopedic evaluation due to chronic nature of your complaint.

## 2018-02-12 NOTE — ED Triage Notes (Addendum)
Pt c/o left shoulder, pain has hx of the same, states the pain started about a day ago, states pain worse over night. Pt has arm in sling at this time.  Pt states he took 5 200mg  ibuprofen this morning.

## 2018-02-12 NOTE — ED Provider Notes (Signed)
[facility] Emergency Department Provider Note   ____________________________________________   First MD Initiated Contact with Patient 02/12/18 [phone]     (approximate)  I have reviewed the triage vital signs and the nursing notes.   HISTORY  Chief Complaint Shoulder Pain    HPI Nathaniel Rowe is a 27 y.o. male patient complain of left shoulder pain for 1 day.  Patient denies provocative complaint.  Patient state this ongoing problem secondary to injury he had 8 years ago resulting in the episodic pain.  Last incident occurred approximately 10 months ago.  Patient normally pain is controlled with NSAIDs.  Patient rates pain as a 9/10.  Patient described pain is "aching".  Patient is wearing a sling from previous ED visits.   History reviewed. No pertinent past medical history.  There are no active problems to display for this patient.   History reviewed. No pertinent surgical history.  Prior to Admission medications   Medication Sig Start Date End Date Taking? Authorizing Provider  cyclobenzaprine (FLEXERIL) 10 MG tablet Take 1 tablet (10 mg total) by mouth 3 (three) times daily as needed. 02/12/18   Joni Reining, PA-C  ibuprofen (ADVIL,MOTRIN) 600 MG tablet Take 1 tablet (600 mg total) by mouth every 8 (eight) hours as needed. 11/02/17   Tommi Rumps, PA-C  methocarbamol (ROBAXIN) 500 MG tablet Take 1 tablet (500 mg total) by mouth 4 (four) times daily. 11/02/17   Tommi Rumps, PA-C  naproxen (NAPROSYN) 500 MG tablet Take 1 tablet (500 mg total) by mouth 2 (two) times daily with a meal. 02/12/18   Joni Reining, PA-C  tamsulosin (FLOMAX) 0.4 MG CAPS capsule Take 1 capsule (0.4 mg total) by mouth daily. 03/18/16   Charlynne Pander, MD    Allergies Patient has no known allergies.  History reviewed. No pertinent family history.  Social History Social History   Tobacco Use  . Smoking status: Current Every Day Smoker    Packs/day:  1.00    Years: 0.00    Pack years: 0.00    Types: Cigarettes    Last attempt to quit: 02/25/2016    Years since quitting: 1.9  . Smokeless tobacco: Never Used  Substance Use Topics  . Alcohol use: No  . Drug use: Yes    Types: Marijuana    Review of Systems Constitutional: No fever/chills Eyes: No visual changes. ENT: No sore throat. Cardiovascular: Denies chest pain. Respiratory: Denies shortness of breath. Gastrointestinal: No abdominal pain.  No nausea, no vomiting.  No diarrhea.  No constipation. Genitourinary: Negative for dysuria. Musculoskeletal: Left shoulder pain.. Skin: Negative for rash. Neurological: Negative for headaches, focal weakness or numbness. ____________________________________________   PHYSICAL EXAM:  VITAL SIGNS: ED Triage Vitals  Enc Vitals Group     BP 02/12/18 0738 140/79     Pulse Rate 02/12/18 0738 67     Resp 02/12/18 0738 18     Temp 02/12/18 0738 97.9 F (36.6 C)     Temp Source 02/12/18 0738 Oral     SpO2 02/12/18 0738 99 %     Weight 02/12/18 0740 230 lb (104.3 kg)     Height 02/12/18 0740 6\' 2"  (1.88 m)     Head Circumference --      Peak Flow --      Pain Score 02/12/18 0740 9     Pain Loc --      Pain Edu? --      Excl. in GC? --  Constitutional: Alert and oriented. Well appearing and in no acute distress. Neck: No stridor. Hematological/Lymphatic/Immunilogical: No cervical lymphadenopathy. Cardiovascular: Normal rate, regular rhythm. Grossly normal heart sounds.  Good peripheral circulation. Respiratory: Normal respiratory effort.  No retractions. Lungs CTAB. Musculoskeletal: No obvious deformity to the left shoulder.  [facility].  Decreased range of motion limited by complaint of pain.Marland Kitchen. Neurologic:  Normal speech and language. No gross focal neurologic deficits are appreciated. No gait instability. Skin:  Skin is warm, dry and intact. No rash noted. Psychiatric: Mood and affect are  normal. Speech and behavior are normal.  ____________________________________________   LABS (all labs ordered are listed, but only abnormal results are displayed)  Labs Reviewed - No data to display ____________________________________________  EKG   ____________________________________________  RADIOLOGY  ED MD interpretation:    Official radiology report(s): Dg Shoulder Left  Result Date: 02/12/2018 CLINICAL DATA:  Pt states he woke up this AM with left shoulder pain all over shoulder. Has had issues on and off with this same shoulder for a while but no prev injury or surgery. EXAM: LEFT SHOULDER - 2+ VIEW COMPARISON:  05/31/2017 FINDINGS: There is no evidence of fracture or dislocation. There is no evidence of arthropathy or other focal bone abnormality. Soft tissues are unremarkable. IMPRESSION: Negative. Electronically Signed   By: Amie Portlandavid  Ormond M.D.   On: 02/12/2018 08:14    ____________________________________________   PROCEDURES  Procedure(s) performed: None  Procedures  Critical Care performed: No  ____________________________________________   INITIAL IMPRESSION / ASSESSMENT AND PLAN / ED COURSE  As part of my medical decision making, I reviewed the following data within the electronic MEDICAL RECORD NUMBER    Non-provocative left shoulder pain.  Discussed negative x-ray findings with patient.  Patient given discharge care instruction advised take medication as directed.  Patient advised to follow-up with orthopedic due to the chronic nature of his complaint.      ____________________________________________   FINAL CLINICAL IMPRESSION(S) / ED DIAGNOSES  Final diagnoses:  Acute pain of left shoulder     ED Discharge Orders        Ordered    naproxen (NAPROSYN) 500 MG tablet  2 times daily with meals     02/12/18 0826    cyclobenzaprine (FLEXERIL) 10 MG tablet  3 times daily PRN     02/12/18 [id number]       Note:  This document was prepared using  Dragon voice recognition software and may include unintentional dictation errors.    Joni ReiningSmith, Dody Smartt K, PA-C 02/12/18 [id number]    Sharman CheekStafford, Phillip, MD 02/16/18 [phone]

## 2018-03-04 ENCOUNTER — Emergency Department: Payer: Self-pay

## 2018-03-04 ENCOUNTER — Other Ambulatory Visit: Payer: Self-pay

## 2018-03-04 ENCOUNTER — Encounter: Payer: Self-pay | Admitting: Emergency Medicine

## 2018-03-04 ENCOUNTER — Emergency Department
Admission: EM | Admit: 2018-03-04 | Discharge: 2018-03-04 | Disposition: A | Discharge status: Home / Self Care | Payer: Self-pay | Attending: Emergency Medicine | Admitting: Emergency Medicine

## 2018-03-04 DIAGNOSIS — R531 Weakness: Secondary | ICD-10-CM | POA: Insufficient documentation

## 2018-03-04 DIAGNOSIS — R002 Palpitations: Secondary | ICD-10-CM | POA: Insufficient documentation

## 2018-03-04 DIAGNOSIS — R638 Other symptoms and signs concerning food and fluid intake: Secondary | ICD-10-CM | POA: Insufficient documentation

## 2018-03-04 DIAGNOSIS — Z79899 Other long term (current) drug therapy: Secondary | ICD-10-CM | POA: Insufficient documentation

## 2018-03-04 DIAGNOSIS — F1210 Cannabis abuse, uncomplicated: Secondary | ICD-10-CM | POA: Insufficient documentation

## 2018-03-04 DIAGNOSIS — F17210 Nicotine dependence, cigarettes, uncomplicated: Secondary | ICD-10-CM | POA: Insufficient documentation

## 2018-03-04 LAB — BASIC METABOLIC PANEL
ANION GAP: 8 (ref 5–15)
BUN: 15 mg/dL (ref 6–20)
CHLORIDE: 105 mmol/L (ref 98–111)
CO2: 29 mmol/L (ref 22–32)
Calcium: 9.8 mg/dL (ref 8.9–10.3)
Creatinine, Ser: 0.92 mg/dL (ref 0.61–1.24)
GFR calc non Af Amer: >60 mL/min (ref >60)
Glucose, Bld: 108 mg/dL — ABNORMAL HIGH (ref 70–99)
Potassium: 3.4 mmol/L — ABNORMAL LOW (ref 3.5–5.1)
Sodium: 142 mmol/L (ref 135–145)

## 2018-03-04 LAB — MAGNESIUM: Magnesium: 2 mg/dL (ref 1.7–2.4)

## 2018-03-04 LAB — CBC
HCT: 53.2 % — ABNORMAL HIGH (ref 40.0–52.0)
HEMOGLOBIN: 18.6 g/dL — AB (ref 13.0–18.0)
MCH: 32 pg (ref 26.0–34.0)
MCHC: 34.9 g/dL (ref 32.0–36.0)
MCV: 91.7 fL (ref 80.0–100.0)
Platelets: 223 10*3/uL (ref 150–440)
RBC: 5.81 MIL/uL (ref 4.40–5.90)
RDW: 13.4 % (ref 11.5–14.5)
WBC: 9.8 10*3/uL (ref 3.8–10.6)

## 2018-03-04 LAB — TSH: TSH: 2.842 u[IU]/mL (ref 0.350–4.500)

## 2018-03-04 LAB — TROPONIN I: Troponin I: <0.03 ng/mL (ref <0.03)

## 2018-03-04 MED ORDER — SODIUM CHLORIDE 0.9 % IV BOLUS
1000.0000 mL | Freq: Once | INTRAVENOUS | Status: AC
Start: 2018-03-04 — End: 2018-03-04
  Administered 2018-03-04: 1000 mL via INTRAVENOUS

## 2018-03-04 NOTE — ED Triage Notes (Addendum)
Pt presents to ED with feeling like his "heart is beating faster than it should be for the past couple of days". Pt denies chest pain at this time. Pt states he "just doesn't feel right". Pt alert and appears anxious. Has not been able to eat much today. Denies nausea. No increased work of breathing or acute distress noted. Pt states he "feels perfectly normal when he wakes up in the morning." symptoms worsen throughout the day.

## 2018-03-04 NOTE — ED Provider Notes (Signed)
[facility] Emergency Department Provider Note  ____________________________________________  Time seen: Approximately 10:30 PM  I have reviewed the triage vital signs and the nursing notes.   HISTORY  Chief Complaint Palpitations   HPI Nathaniel Rowe is a 27 y.o. male with a history of bipolar disorder and ADHD who presents for evaluation of palpitations.  Patient reports 2.5 days of constant palpitations.  No chest pain, no shortness of breath.  He reports that the sensation is constant 24 hours a day for the last 2.5 days.  No dizziness.  Patient has had decreased appetite since all of this started.  He is complaining of generalized weakness. No fever, no vomiting, no diarrhea, no abdominal pain.  He smokes cigarettes and marijuana.  He denies drinking alcohol or any other drug use.  No personal history of blood clots, recent travel immobilization, leg pain or swelling, hemoptysis, or exogenous hormones.  Patient denies ever having similar symptoms.  He reports being a little stressed out.  He has an appointment scheduled with his PCP tomorrow however his mother was very concerned since he lives alone and want him to be evaluated.  Past Surgical History:  Procedure Laterality Date  . TYMPANOSTOMY TUBE PLACEMENT      Prior to Admission medications   Medication Sig Start Date End Date Taking? Authorizing Provider  cyclobenzaprine (FLEXERIL) 10 MG tablet Take 1 tablet (10 mg total) by mouth 3 (three) times daily as needed. 02/12/18   Joni ReiningSmith, Ronald K, PA-C  ibuprofen (ADVIL,MOTRIN) 600 MG tablet Take 1 tablet (600 mg total) by mouth every 8 (eight) hours as needed. 11/02/17   Tommi RumpsSummers, Rhonda L, PA-C  methocarbamol (ROBAXIN) 500 MG tablet Take 1 tablet (500 mg total) by mouth 4 (four) times daily. 11/02/17   Tommi RumpsSummers, Rhonda L, PA-C  naproxen (NAPROSYN) 500 MG tablet Take 1 tablet (500 mg total) by mouth 2 (two) times daily with a meal. 02/12/18   Joni ReiningSmith, Ronald K,  PA-C  tamsulosin (FLOMAX) 0.4 MG CAPS capsule Take 1 capsule (0.4 mg total) by mouth daily. 03/18/16   Charlynne PanderYao, David Hsienta, MD    Allergies Patient has no known allergies.  FH Mother - anxiety  Social History Social History   Tobacco Use  . Smoking status: Current Every Day Smoker    Packs/day: 1.00    Years: 0.00    Pack years: 0.00    Types: Cigarettes    Last attempt to quit: 02/25/2016    Years since quitting: 2.0  . Smokeless tobacco: Never Used  Substance Use Topics  . Alcohol use: No  . Drug use: Yes    Types: Marijuana    Review of Systems  Constitutional: Negative for fever. + Generalized Eyes: Negative for visual changes. ENT: Negative for sore throat. Neck: No neck pain  Cardiovascular: Negative for chest pain. + palpitations Respiratory: Negative for shortness of breath. Gastrointestinal: Negative for abdominal pain, vomiting or diarrhea. Genitourinary: Negative for dysuria. Musculoskeletal: Negative for back pain. Skin: Negative for rash. Neurological: Negative for headaches, weakness or numbness. Psych: No SI or HI  ____________________________________________   PHYSICAL EXAM:  VITAL SIGNS: ED Triage Vitals [03/04/18 1932]  Enc Vitals Group     BP (!) 170/95     Pulse Rate 75     Resp 20     Temp (!) 97.5 F (36.4 C)     Temp Source Oral     SpO2 100 %     Weight 220 lb (99.8 kg)  Height 6\' 2"  (1.88 m)     Head Circumference      Peak Flow      Pain Score 0     Pain Loc      Pain Edu?      Excl. in GC?     Constitutional: Alert and oriented. Well appearing and in no apparent distress. HEENT:      Head: Normocephalic and atraumatic.         Eyes: Conjunctivae are normal. Sclera is non-icteric.       Mouth/Throat: Mucous membranes are moist.       Neck: Supple with no signs of meningismus. Cardiovascular: Regular rate and rhythm. No murmurs, gallops, or rubs. 2+ symmetrical distal pulses are present in all extremities. No  JVD. Respiratory: Normal respiratory effort. Lungs are clear to auscultation bilaterally. No wheezes, crackles, or rhonchi.  Gastrointestinal: Soft, non tender, and non distended with positive bowel sounds. No rebound or guarding. Musculoskeletal: Nontender with normal range of motion in all extremities. No edema, cyanosis, or erythema of extremities. Neurologic: Normal speech and language. Face is symmetric. Moving all extremities. No gross focal neurologic deficits are appreciated. Skin: Skin is warm, dry and intact. No rash noted. Psychiatric: Mood and affect are normal. Speech and behavior are normal.  ____________________________________________   LABS (all labs ordered are listed, but only abnormal results are displayed)  Labs Reviewed  BASIC METABOLIC PANEL - Abnormal; Notable for the following components:      Result Value   Potassium 3.4 (*)    Glucose, Bld 108 (*)    All other components within normal limits  CBC - Abnormal; Notable for the following components:   Hemoglobin 18.6 (*)    HCT 53.2 (*)    All other components within normal limits  TROPONIN I  TSH  MAGNESIUM   ____________________________________________  EKG  ED ECG REPORT I, Nita Sicklearolina Shacora Zynda, the attending physician, personally viewed and interpreted this ECG.   Normal sinus rhythm, normal intervals, normal axis, no STE or depressions, no evidence of HOCM, AV block, delta wave, ARVD, prolonged QTc, WPW, or Brugada. No prior available  ____________________________________________  RADIOLOGY  I have personally reviewed the images performed during this visit and I agree with the Radiologist's read.   Interpretation by Radiologist:  Dg Chest 2 View  Result Date: 03/04/2018 CLINICAL DATA:  Palpitations.  Smoker. EXAM: CHEST - 2 VIEW COMPARISON:  None. FINDINGS: Midline trachea.  Normal heart size and mediastinal contours. Sharp costophrenic angles.  No pneumothorax.  Clear lungs. IMPRESSION: No  active cardiopulmonary disease. Electronically Signed   By: Jeronimo GreavesKyle  Talbot M.D.   On: 03/04/2018 20:09    ____________________________________________   PROCEDURES  Procedure(s) performed: None Procedures Critical Care performed:  None ____________________________________________   INITIAL IMPRESSION / ASSESSMENT AND PLAN / ED COURSE   27 y.o. male with a history of bipolar disorder and ADHD who presents for evaluation of constant palpitations for 2.5 days.  Patient is complaining of palpitations at this time while his heart rate is 65, regular, with no PVCs or PACs.  EKG shows no dysrhythmias or ischemia.  Chest x-ray shows no evidence of edema or cardiomegaly, no pneumonia or pneumothorax.  Labs showing normal electrolytes.  Patient looks hemoconcentrated with a hemoglobin of 18.6 and hematocrit of 53.2.  His symptoms could be from dehydration.  Will give IV fluids.  TSH showing no evidence of thyroid dysfunction.  Troponin is negative.  Will monitor on telemetry for any signs of arrhythmia  and reassess after fluids    _________________________ 10:55 PM on 03/04/2018 -----------------------------------------  Patient monitor on telemetry with no evidence of dysrhythmias.  He does report drinking a lot of coffee so I recommended reducing caffeine.  Labs are within normal limits.  Patient feels better after IV fluids.  He has an appointment with his PCP tomorrow in the morning which I recommended that he goes to.  Discussed return precautions for chest pain, new or worsening palpitations, dizziness, otherwise patient will follow-up with his doctor.   As part of my medical decision making, I reviewed the following data within the electronic MEDICAL RECORD NUMBER Nursing notes reviewed and incorporated, Labs reviewed , EKG interpreted , Old chart reviewed, Radiograph reviewed , Notes from prior ED visits and [place] Controlled Substance Database    Pertinent labs & imaging results that were available  during my care of the patient were reviewed by me and considered in my medical decision making (see chart for details).    ____________________________________________   FINAL CLINICAL IMPRESSION(S) / ED DIAGNOSES  Final diagnoses:  Palpitations      NEW MEDICATIONS STARTED DURING THIS VISIT:  ED Discharge Orders    None       Note:  This document was prepared using Dragon voice recognition software and may include unintentional dictation errors.    Nita Sickle, MD 03/04/18 2256

## 2019-07-31 ENCOUNTER — Emergency Department
Admission: EM | Admit: 2019-07-31 | Discharge: 2019-07-31 | Disposition: A | Discharge status: Home / Self Care | Payer: Self-pay | Attending: Student | Admitting: Student

## 2019-07-31 ENCOUNTER — Other Ambulatory Visit: Payer: Self-pay

## 2019-07-31 ENCOUNTER — Encounter: Payer: Self-pay | Admitting: Emergency Medicine

## 2019-07-31 DIAGNOSIS — F17210 Nicotine dependence, cigarettes, uncomplicated: Secondary | ICD-10-CM | POA: Insufficient documentation

## 2019-07-31 DIAGNOSIS — K0500 Acute gingivitis, plaque induced: Secondary | ICD-10-CM | POA: Insufficient documentation

## 2019-07-31 DIAGNOSIS — K069 Disorder of gingiva and edentulous alveolar ridge, unspecified: Secondary | ICD-10-CM

## 2019-07-31 MED ORDER — MAGIC MOUTHWASH W/LIDOCAINE: 5.0000 mL | Freq: Four times a day (QID) | ORAL | 0 refills | Status: AC | PRN

## 2019-07-31 NOTE — ED Provider Notes (Signed)
[facility] EMERGENCY DEPARTMENT Provider Note   CSN: [id number] Arrival date & time: 07/31/19  1357     History Chief Complaint  Patient presents with  . gum pain    Nathaniel Rowe is a 29 y.o. male presents to the emergency department for evaluation of redness and irritation to the inner aspect of his lips and gums.  He denies any new foods, medications.  He admits to vaping but has not changed any chemicals entering his body.  He states earlier today developed some redness and mild swelling to the gums and inner aspect of the upper and lower lip.  He denies any bleeding, facial swelling, difficulty swallowing.  Feels a little bit of mild numbness throughout the gums and lips.  No other numbness or weakness noted.  Tolerating p.o. well.  HPI     History reviewed. No pertinent past medical history.  There are no problems to display for this patient.   Past Surgical History:  Procedure Laterality Date  . TYMPANOSTOMY TUBE PLACEMENT         No family history on file.  Social History   Tobacco Use  . Smoking status: Current Every Day Smoker    Packs/day: 1.00    Years: 0.00    Pack years: 0.00    Types: Cigarettes    Last attempt to quit: 02/25/2016    Years since quitting: 3.4  . Smokeless tobacco: Never Used  Substance Use Topics  . Alcohol use: No  . Drug use: Yes    Types: Marijuana    Home Medications Prior to Admission medications   Medication Sig Start Date End Date Taking? Authorizing Provider  cyclobenzaprine (FLEXERIL) 10 MG tablet Take 1 tablet (10 mg total) by mouth 3 (three) times daily as needed. 02/12/18   Sable Feil, PA-C  ibuprofen (ADVIL,MOTRIN) 600 MG tablet Take 1 tablet (600 mg total) by mouth every 8 (eight) hours as needed. 11/02/17   Johnn Hai, PA-C  magic mouthwash w/lidocaine SOLN Take 5 mLs by mouth 4 (four) times daily as needed for mouth pain. 1:1:1:1 ratio.  Swish gargle and spit every 6 hours as  needed. 07/31/19   Duanne Guess, PA-C  methocarbamol (ROBAXIN) 500 MG tablet Take 1 tablet (500 mg total) by mouth 4 (four) times daily. 11/02/17   Johnn Hai, PA-C  naproxen (NAPROSYN) 500 MG tablet Take 1 tablet (500 mg total) by mouth 2 (two) times daily with a meal. 02/12/18   Sable Feil, PA-C  tamsulosin (FLOMAX) 0.4 MG CAPS capsule Take 1 capsule (0.4 mg total) by mouth daily. 03/18/16   Drenda Freeze, MD    Allergies    Patient has no known allergies.  Review of Systems   Review of Systems  Constitutional: Negative for fatigue and fever.  HENT: Positive for mouth sores. Negative for facial swelling, sore throat, trouble swallowing and voice change.   Respiratory: Negative for cough and shortness of breath.   Gastrointestinal: Negative for nausea and vomiting.  Skin: Negative for rash and wound.    Physical Exam Updated Vital Signs BP 132/71   Pulse 90   Temp 98.9 F (37.2 C) (Oral)   Resp 20   Ht 6\' 2"  (1.88 m)   Wt 122.5 kg   SpO2 98%   BMI 34.67 kg/m   Physical Exam Vitals reviewed.  Constitutional:      Appearance: Normal appearance.  HENT:     Head: Normocephalic and atraumatic.  Comments: Intraoral examination shows no swelling or edema.  No bleeding.  There is gingival erythema on the upper and lower mouth with redness noted along the inner aspect of the lower lip and upper lip with no focal lesions.  Moderate gingival disease noted with plaque noted along the gum lines of all teeth.  No pharyngeal erythema or exudates.    Right Ear: External ear normal.     Left Ear: External ear normal.     Nose: Nose normal.  Eyes:     Conjunctiva/sclera: Conjunctivae normal.  Cardiovascular:     Rate and Rhythm: Normal rate.  Pulmonary:     Effort: Pulmonary effort is normal. No respiratory distress.  Musculoskeletal:     Cervical back: Normal range of motion.  Neurological:     Mental Status: He is alert.     ED Results / Procedures /  Treatments   Labs (all labs ordered are listed, but only abnormal results are displayed) Labs Reviewed - No data to display  EKG None  Radiology No results found.  Procedures Procedures (including critical care time)  Medications Ordered in ED Medications - No data to display  ED Course  I have reviewed the triage vital signs and the nursing notes.  Pertinent labs & imaging results that were available during my care of the patient were reviewed by me and considered in my medical decision making (see chart for details).    MDM Rules/Calculators/A&P                      29 year old male with gingival erythema and irritation.  No pain.  Vital signs are stable.  No facial swelling.  No signs of gross abscess formation.  No signs of allergic reaction based on history and exam.  He has what appears to be focal skin irritation along the gums and inner aspect of the lips.  He is placed on Magic mouthwash.  He understands signs and symptoms return to ED for such as pain, swelling, fevers, sloughing of skin, worsening symptoms or urgent changes in his health. Final Clinical Impression(s) / ED Diagnoses Final diagnoses:  Acute gingival disease    Rx / DC Orders ED Discharge Orders         Ordered    magic mouthwash w/lidocaine SOLN  4 times daily PRN     07/31/19 1620           Ronnette Juniper 07/31/19 1632    Miguel Aschoff., MD 07/31/19 2010

## 2019-07-31 NOTE — Discharge Instructions (Signed)
Please use Magic mouthwash as prescribed as needed.  Please schedule follow-up appointment with dental clinic.  Return to the ER for any increased pain, swelling, fevers, sloughing of skin, difficulty swallowing, worsening symptoms or urgent changes in health.

## 2019-07-31 NOTE — ED Notes (Signed)
Pt states numbness and discomfort in lips and redness inside mouth. Redness and mild swelling to cheeks and lips. Pt states it started after brushing teeth and vaping.

## 2019-07-31 NOTE — ED Triage Notes (Signed)
States gums started hurting after brushed teeth about 1 hour ago. Gums noted reddened at tooth line.

## 2019-09-02 ENCOUNTER — Emergency Department
Admission: EM | Admit: 2019-09-02 | Discharge: 2019-09-02 | Disposition: A | Discharge status: Home / Self Care | Payer: Self-pay | Attending: Student in an Organized Health Care Education/Training Program | Admitting: Student in an Organized Health Care Education/Training Program

## 2019-09-02 ENCOUNTER — Encounter: Payer: Self-pay | Admitting: Emergency Medicine

## 2019-09-02 ENCOUNTER — Other Ambulatory Visit: Payer: Self-pay

## 2019-09-02 ENCOUNTER — Emergency Department: Payer: Self-pay

## 2019-09-02 DIAGNOSIS — M7122 Synovial cyst of popliteal space [Baker], left knee: Secondary | ICD-10-CM | POA: Insufficient documentation

## 2019-09-02 DIAGNOSIS — F1210 Cannabis abuse, uncomplicated: Secondary | ICD-10-CM | POA: Insufficient documentation

## 2019-09-02 DIAGNOSIS — M25562 Pain in left knee: Secondary | ICD-10-CM | POA: Insufficient documentation

## 2019-09-02 DIAGNOSIS — F17210 Nicotine dependence, cigarettes, uncomplicated: Secondary | ICD-10-CM | POA: Insufficient documentation

## 2019-09-02 MED ORDER — TRAMADOL HCL 50 MG PO TABS: 50.0000 mg | ORAL_TABLET | Freq: Four times a day (QID) | ORAL | 0 refills | Status: AC | PRN

## 2019-09-02 MED ORDER — NAPROXEN 500 MG PO TABS: 500.0000 mg | ORAL_TABLET | Freq: Two times a day (BID) | ORAL | Status: DC

## 2019-09-02 NOTE — ED Provider Notes (Signed)
[facility] Emergency Department Provider Note   ____________________________________________   First MD Initiated Contact with Patient 09/02/19 [phone]     (approximate)  I have reviewed the triage vital signs and the nursing notes.   HISTORY  Chief Complaint Knee Injury    HPI Nathaniel Rowe is a 29 y.o. male patient complain of left knee pain secondary to a fall 6 days ago.  Patient stated initially he could ambulate without discomfort.  Patient stated the last few days   increasing pain to the inferior left patella.  Patient can still ambulate.  Patient rates pain as a 2/10.  Patient described pain is "achy".  No palliative measure for complaint.      History reviewed. No pertinent past medical history.  There are no problems to display for this patient.   Past Surgical History:  Procedure Laterality Date  . TYMPANOSTOMY TUBE PLACEMENT      Prior to Admission medications   Medication Sig Start Date End Date Taking? Authorizing Provider  cyclobenzaprine (FLEXERIL) 10 MG tablet Take 1 tablet (10 mg total) by mouth 3 (three) times daily as needed. 02/12/18   Joni Reining, PA-C  ibuprofen (ADVIL,MOTRIN) 600 MG tablet Take 1 tablet (600 mg total) by mouth every 8 (eight) hours as needed. 11/02/17   Tommi Rumps, PA-C  magic mouthwash w/lidocaine SOLN Take 5 mLs by mouth 4 (four) times daily as needed for mouth pain. 1:1:1:1 ratio.  Swish gargle and spit every 6 hours as needed. 07/31/19   Evon Slack, PA-C  methocarbamol (ROBAXIN) 500 MG tablet Take 1 tablet (500 mg total) by mouth 4 (four) times daily. 11/02/17   Tommi Rumps, PA-C  naproxen (NAPROSYN) 500 MG tablet Take 1 tablet (500 mg total) by mouth 2 (two) times daily with a meal. 02/12/18   Joni Reining, PA-C  naproxen (NAPROSYN) 500 MG tablet Take 1 tablet (500 mg total) by mouth 2 (two) times daily with a meal. 09/02/19   Joni Reining, PA-C  tamsulosin (FLOMAX) 0.4 MG CAPS  capsule Take 1 capsule (0.4 mg total) by mouth daily. 03/18/16   Charlynne Pander, MD  traMADol (ULTRAM) 50 MG tablet Take 1 tablet (50 mg total) by mouth every 6 (six) hours as needed for up to 3 days. 09/02/19 09/05/19  Joni Reining, PA-C    Allergies Patient has no known allergies.  No family history on file.  Social History Social History   Tobacco Use  . Smoking status: Current Every Day Smoker    Packs/day: 1.00    Years: 0.00    Pack years: 0.00    Types: Cigarettes    Last attempt to quit: 02/25/2016    Years since quitting: 3.5  . Smokeless tobacco: Never Used  Substance Use Topics  . Alcohol use: No  . Drug use: Yes    Types: Marijuana    Review of Systems Constitutional: No fever/chills Eyes: No visual changes. ENT: No sore throat. Cardiovascular: Denies chest pain. Respiratory: Denies shortness of breath. Gastrointestinal: No abdominal pain.  No nausea, no vomiting.  No diarrhea.  No constipation. Genitourinary: Negative for dysuria. Musculoskeletal: Left knee pain. Skin: Negative for rash. Neurological: Negative for headaches, focal weakness or numbness.   ____________________________________________   PHYSICAL EXAM:  VITAL SIGNS: ED Triage Vitals [09/02/19 0858]  Enc Vitals Group     BP (!) 142/78     Pulse Rate 89     Resp 18  Temp 97.9 F (36.6 C)     Temp Source Oral     SpO2 98 %     Weight 280 lb (127 kg)     Height 6\' 2"  (1.88 m)     Head Circumference      Peak Flow      Pain Score 2     Pain Loc      Pain Edu?      Excl. in [place]?    Constitutional: Alert and oriented. Well appearing and in no acute distress. Cardiovascular: Normal rate, regular rhythm. Grossly normal heart sounds.  Good peripheral circulation. Respiratory: Normal respiratory effort.  No retractions. Lungs CTAB. Musculoskeletal: Obvious deformity to the left knee.  Patient has moderate guarding palpation of the tibial tuberosity and popliteal area.  Patient has  full equal range of motion.   Neurologic:  Normal speech and language. No gross focal neurologic deficits are appreciated. No gait instability. Skin:  Skin is warm, dry and intact. No rash noted.  Healing abrasion anterior patella and tibial tuberosity. Psychiatric: Mood and affect are normal. Speech and behavior are normal.  ____________________________________________   LABS (all labs ordered are listed, but only abnormal results are displayed)  Labs Reviewed - No data to display ____________________________________________  EKG   ____________________________________________  RADIOLOGY  ED MD interpretation:    Official radiology report(s): DG Knee Complete 4 Views Left  Result Date: 09/02/2019 CLINICAL DATA:  Left knee pain, anterior and lateral, after fall x4 days ago. Pt states he fell directly onto left knee. No previous injury or surgery to left knee. EXAM: LEFT KNEE - COMPLETE 4+ VIEW COMPARISON:  None. FINDINGS: No evidence of fracture, dislocation, or joint effusion. No evidence of arthropathy or other focal bone abnormality. Soft tissues are unremarkable. IMPRESSION: Negative radiographs of the left knee. Electronically Signed   By: Audie Pinto M.D.   On: 09/02/2019 09:35    ____________________________________________   PROCEDURES  Procedure(s) performed (including Critical Care):  Procedures   ____________________________________________   INITIAL IMPRESSION / ASSESSMENT AND PLAN / ED COURSE  As part of my medical decision making, I reviewed the following data within the [place]     Patient presents with 6 days of left knee pain secondary to a fall.  Discussed x-ray findings with patient showing no fractures.  Patient had has a Baker's cyst.  His exam is consistent with knee and leg pain secondary to contusion.  Patient given discharge care instruction.  Advised take medication as directed and follow-up orthopedic if no improvement  in 3 to 5 days.    Nathaniel Rowe was evaluated in Emergency Department on 09/02/2019 for the symptoms described in the history of present illness. He was evaluated in the context of the global COVID-19 pandemic, which necessitated consideration that the patient might be at risk for infection with the SARS-CoV-2 virus that causes COVID-19. Institutional protocols and algorithms that pertain to the evaluation of patients at risk for COVID-19 are in a state of rapid change based on information released by regulatory bodies including the CDC and federal and state organizations. These policies and algorithms were followed during the patient's care in the ED.       ____________________________________________   FINAL CLINICAL IMPRESSION(S) / ED DIAGNOSES  Final diagnoses:  Acute pain of left knee  Baker's cyst of knee, left     ED Discharge Orders         Ordered    naproxen (NAPROSYN) 500  MG tablet  2 times daily with meals     09/02/19 0959    traMADol (ULTRAM) 50 MG tablet  Every 6 hours PRN     09/02/19 0959           Note:  This document was prepared using Dragon voice recognition software and may include unintentional dictation errors.    Joni Reining, PA-C 09/02/19 1003    Willy Eddy, MD 09/02/19 1133

## 2019-09-02 NOTE — Discharge Instructions (Addendum)
Follow discharge care instruction take medication as directed.  If no improvement 3 to 5 days follow-up with orthopedic doctor listed on your discharge care instructions.

## 2019-09-02 NOTE — ED Notes (Signed)
Left knee wrapped with ACE wrap with no issue.

## 2019-09-02 NOTE — ED Triage Notes (Signed)
Patient states he fell and landed on left knee on Friday. States pain has gotten worse when touching area below knee cap (has sharp pain to area to left of knee cap).

## 2020-06-09 IMAGING — DX DG KNEE COMPLETE 4+V*L*
4 series · 4 of 4 positions shown · non-contrast
Comparison: None.

CLINICAL DATA: Left knee pain, anterior and lateral, after fall x4
days ago. Pt states he fell directly onto left knee. No previous
injury or surgery to left knee.

EXAM:
LEFT KNEE - COMPLETE 4+ VIEW

[knee ap]
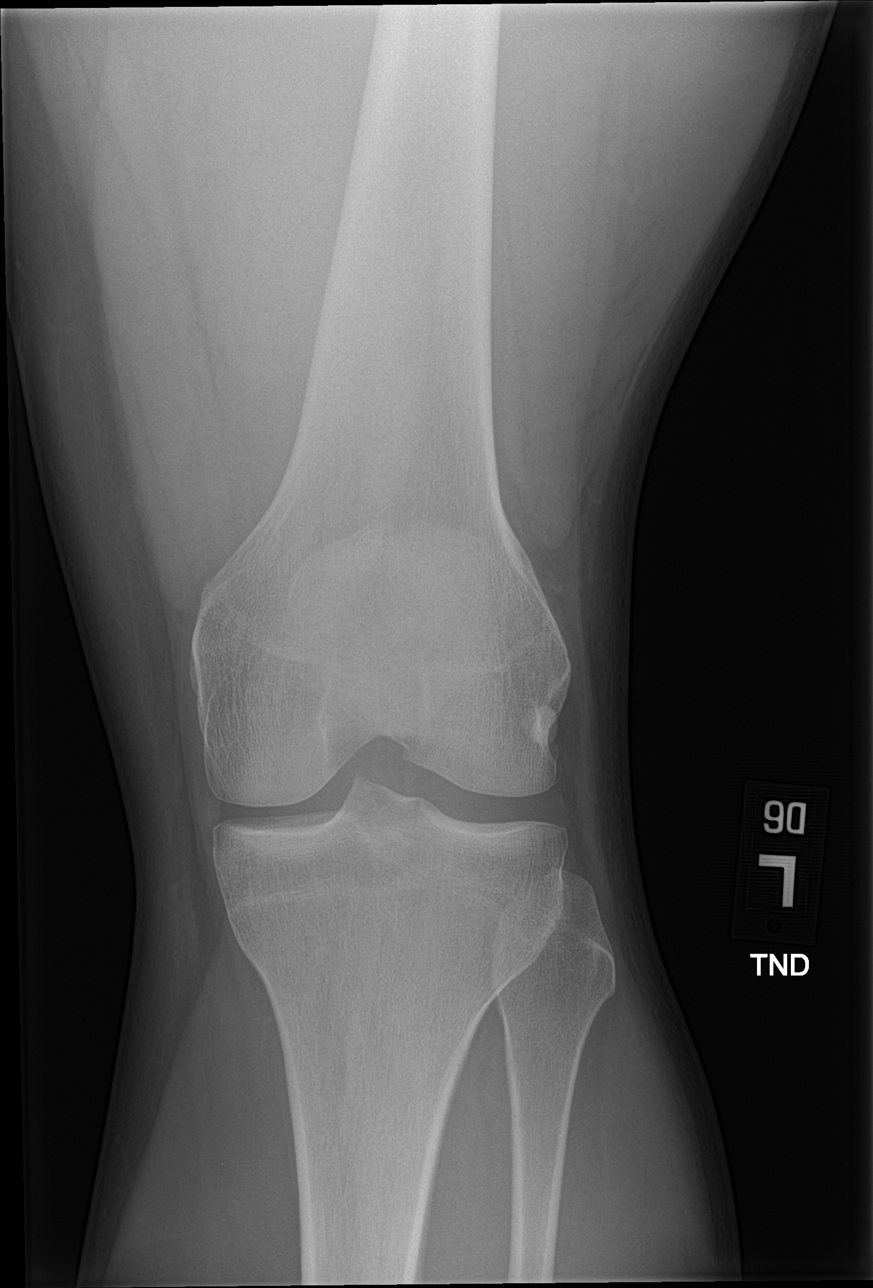

[knee tunnel]
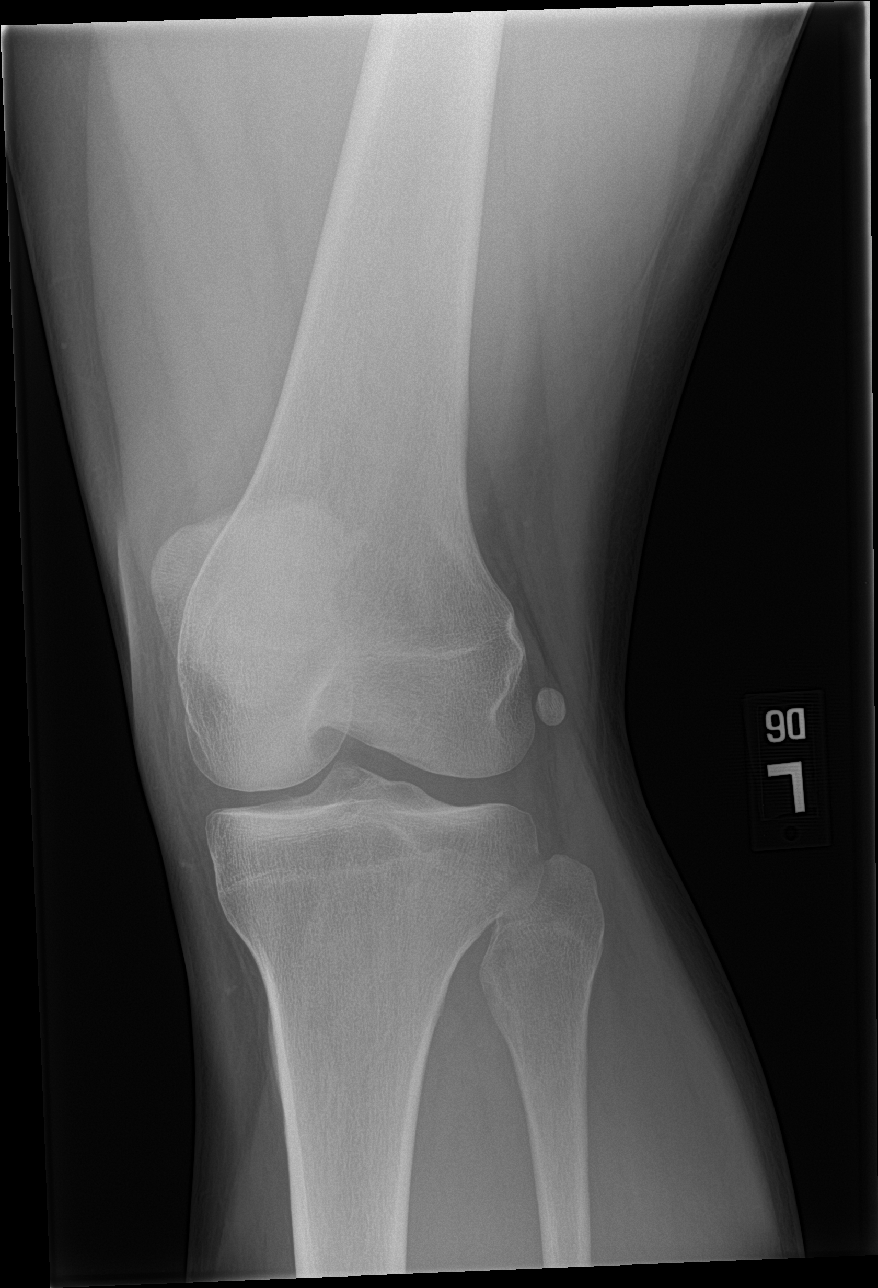

[knee lat]
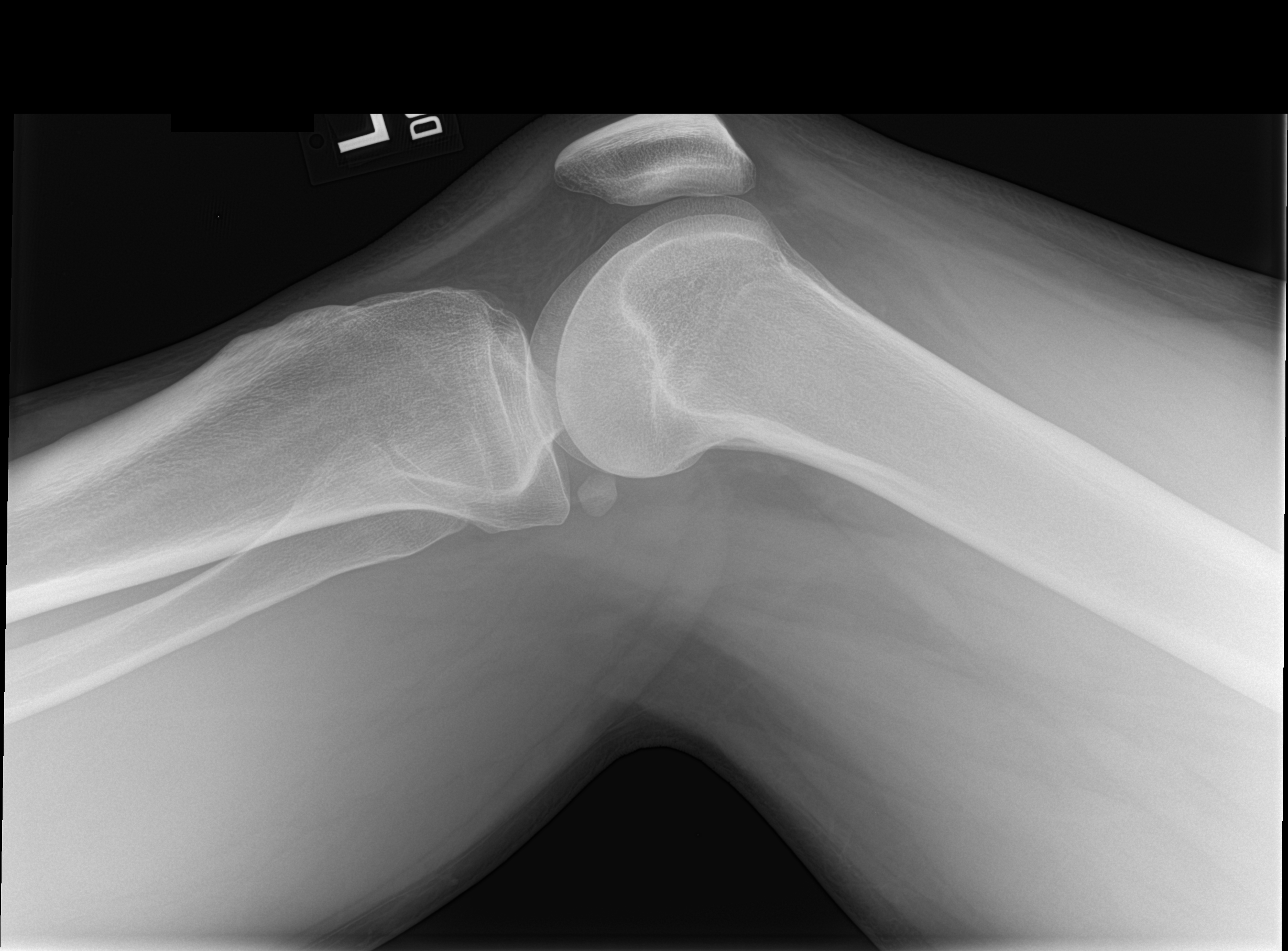

[knee obl]
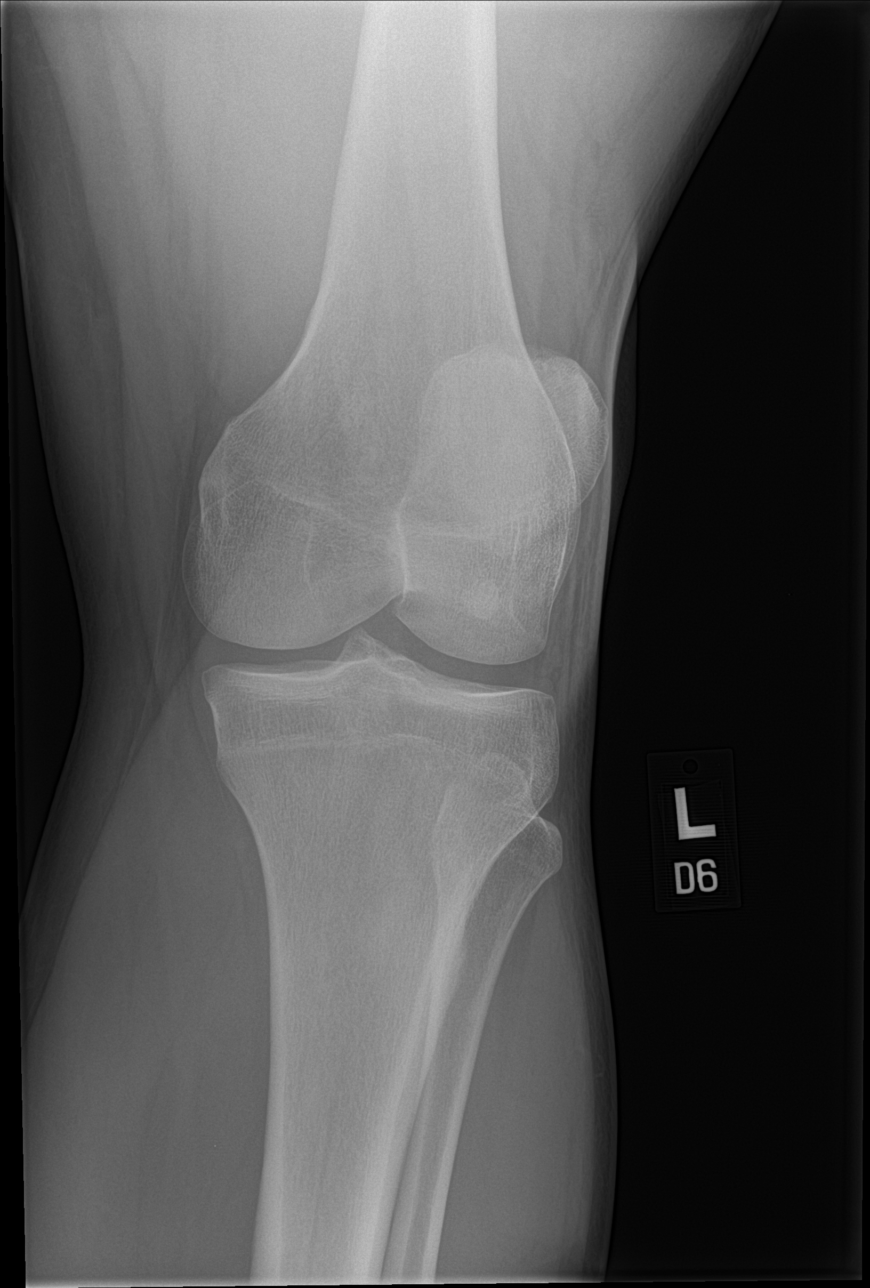

[4 of 4 positions shown; findings below may reference images not displayed]

FINDINGS: No evidence of fracture, dislocation, or joint effusion. No evidence
of arthropathy or other focal bone abnormality. Soft tissues are
unremarkable.
IMPRESSION: Negative radiographs of the left knee.

## 2023-08-03 ENCOUNTER — Emergency Department
Admission: EM | Admit: 2023-08-03 | Discharge: 2023-08-03 | Disposition: A | Discharge status: Home / Self Care | Payer: No Typology Code available for payment source | Attending: Emergency Medicine | Admitting: Emergency Medicine

## 2023-08-03 ENCOUNTER — Other Ambulatory Visit: Payer: Self-pay

## 2023-08-03 DIAGNOSIS — J09X2 Influenza due to identified novel influenza A virus with other respiratory manifestations: Secondary | ICD-10-CM | POA: Insufficient documentation

## 2023-08-03 DIAGNOSIS — Z20822 Contact with and (suspected) exposure to covid-19: Secondary | ICD-10-CM | POA: Insufficient documentation

## 2023-08-03 DIAGNOSIS — J111 Influenza due to unidentified influenza virus with other respiratory manifestations: Secondary | ICD-10-CM

## 2023-08-03 DIAGNOSIS — R059 Cough, unspecified: Secondary | ICD-10-CM | POA: Diagnosis present

## 2023-08-03 LAB — RESP PANEL BY RT-PCR (RSV, FLU A&B, COVID)  RVPGX2
Influenza A by PCR: POSITIVE — AB
Influenza B by PCR: NEGATIVE
Resp Syncytial Virus by PCR: NEGATIVE
SARS Coronavirus 2 by RT PCR: NEGATIVE

## 2023-08-03 LAB — GROUP A STREP BY PCR: Group A Strep by PCR: NOT DETECTED

## 2023-08-03 MED ORDER — ONDANSETRON 4 MG PO TBDP: 4.0000 mg | ORAL_TABLET | Freq: Three times a day (TID) | ORAL | 0 refills | Status: AC | PRN

## 2023-08-03 NOTE — Discharge Instructions (Signed)
 You were seen in the emergency department today for a cough, nausea, headache. Your Influenza is positive.   A viral cough may last up to 2-3 weeks.   Take tylenol  or ibuprofen  for pain or fever as directed.   Stay hydrated by drinking plenty of fluids to thin mucus. Get adequate amount of sleep and avoid overexertion. Consider a humidifier at night. Warm teas and a spoonful of honey may help reduce cough frequency. Follow up with your primary care provider as needed.

## 2023-08-03 NOTE — ED Provider Triage Note (Signed)
 Emergency Medicine Provider Triage Evaluation Note  ZAKARI BATHE , a 33 y.o. male  was evaluated in triage.  Pt complains of Fever, nausea, and sore throat x 2 days. Has had 1 episode of vomiting last night. Endorses a fever of up to 102. His roommate was just diagnosed with a viral URI. Has tried ibuprofen , and NyQuil with limited relief. No diarrhea     Physical Exam  Ht 6' 2 (1.88 m)   Wt 97.8 kg   BMI 27.69 kg/m  Gen:   Awake, no distress   Resp:  Normal effort  MSK:   Moves extremities without difficulty  Other:  Erythema of throat  Medical Decision Making  Medically screening exam initiated at 6:54 PM.  Appropriate orders placed.  Gadge L Tashiro was informed that the remainder of the evaluation will be completed by another provider, this initial triage assessment does not replace that evaluation, and the importance of remaining in the ED until their evaluation is complete.   Margrette, Taimane Stimmel A, PA-C 08/03/23 1859

## 2023-08-03 NOTE — ED Provider Notes (Signed)
 [facility] Emergency Department Provider Note     Event Date/Time   First MD Initiated Contact with Patient 08/03/23 2025     (approximate)   History   Nausea, Fever, and Dizziness   HPI  Nathaniel Rowe is a 33 y.o. male cough and congestion x 3 days. He reports subjective fever at home.  Associated symptoms includes a headache and nausea.  He states sick contacts of roommate with similar symptoms.  He has tried NyQuil, Mucinex and ibuprofen  with some relief.  No other complaints.      Physical Exam   Triage Vital Signs: ED Triage Vitals  Encounter Vitals Group     BP 08/03/23 1853 124/86     Systolic BP Percentile --      Diastolic BP Percentile --      Pulse Rate 08/03/23 1853 85     Resp 08/03/23 1853 16     Temp 08/03/23 1853 98.7 F (37.1 C)     Temp Source 08/03/23 1853 Oral     SpO2 08/03/23 1853 100 %     Weight 08/03/23 1852 215 lb 11.2 oz (97.8 kg)     Height 08/03/23 1852 6' 2 (1.88 m)     Head Circumference --      Peak Flow --      Pain Score 08/03/23 1852 7     Pain Loc --      Pain Education --      Exclude from Growth Chart --     Most recent vital signs: Vitals:   08/03/23 1853 08/03/23 2111  BP: 124/86 124/77  Pulse: 85 66  Resp: 16 16  Temp: 98.7 F (37.1 C) 99 F (37.2 C)  SpO2: 100% 100%    General: Well appearing. Alert and oriented. INAD.  Skin:  Warm, dry and intact. No rashes or lesions noted.     Head:  NCAT.  Eyes:  PERRLA. EOMI.  Nose:   Mucosa is moist. Mild rhinorrhea. CV:  Good peripheral perfusion. RRR.  RESP:  Normal effort. LCTAB.  ABD:  No distention.  MSK:   Full ROM in all joints. No swelling, deformity or tenderness.  NEURO: Cranial nerves II-XII intact. No focal deficits.   ED Results / Procedures / Treatments   Labs (all labs ordered are listed, but only abnormal results are displayed) Labs Reviewed  RESP PANEL BY RT-PCR (RSV, FLU A&B, COVID)  RVPGX2 - Abnormal; Notable for  the following components:      Result Value   Influenza A by PCR POSITIVE (*)    All other components within normal limits  GROUP A STREP BY PCR    No results found.  PROCEDURES:  Critical Care performed: No  Procedures   MEDICATIONS ORDERED IN ED: Medications - No data to display   IMPRESSION / MDM / ASSESSMENT AND PLAN / ED COURSE  I reviewed the triage vital signs and the nursing notes.                               33 y.o. male presents to the emergency department for evaluation and treatment of fever and cough. See HPI for further details.   Differential diagnosis includes, but is not limited to viral URI, PNA, allergies  Patient's presentation is most consistent with acute complicated illness / injury requiring diagnostic workup.  Patient is alert and oriented.  He is hemodynamically stable  and afebrile.  Physical exam findings are as stated above and overall benign.  No respiratory distress.  Resp panel is positive for influenza.  Symptomatic care education provided.  Patient will be discharged home and encouraged to follow-up with PCP as needed.  Work note provided.  ED return precautions discussed.  Patient is in stable condition at discharge.   FINAL CLINICAL IMPRESSION(S) / ED DIAGNOSES   Final diagnoses:  Influenza   Rx / DC Orders   ED Discharge Orders          Ordered    ondansetron  (ZOFRAN -ODT) 4 MG disintegrating tablet  Every 8 hours PRN        08/03/23 2100             Note:  This document was prepared using Dragon voice recognition software and may include unintentional dictation errors.    Margrette, Lamiya Naas A, PA-C 08/03/23 KATHEREN    Viviann Pastor, MD 08/07/23 [phone]

## 2023-08-03 NOTE — ED Triage Notes (Addendum)
 Pt c/o fever, nausea, and dizziness. Pt reports roommate recently diagnosed with URI. Pt has taken nyquil, mucinex, and ibuprofen  with temporary relief. Pt reports 1 episode of vomiting last night. Pt c/o sore throat, redness present. Pt endorses post-nasal drip.

## 2024-01-06 ENCOUNTER — Other Ambulatory Visit: Payer: Self-pay

## 2024-01-06 ENCOUNTER — Emergency Department
Admission: EM | Admit: 2024-01-06 | Discharge: 2024-01-06 | Disposition: A | Discharge status: Home / Self Care | Payer: Self-pay | Attending: Emergency Medicine | Admitting: Emergency Medicine

## 2024-01-06 DIAGNOSIS — H8110 Benign paroxysmal vertigo, unspecified ear: Secondary | ICD-10-CM | POA: Insufficient documentation

## 2024-01-06 LAB — CBC
HCT: 50.9 % (ref 39.0–52.0)
Hemoglobin: 17.8 g/dL — ABNORMAL HIGH (ref 13.0–17.0)
MCH: 30.8 pg (ref 26.0–34.0)
MCHC: 35 g/dL (ref 30.0–36.0)
MCV: 88.1 fL (ref 80.0–100.0)
Platelets: 252 10*3/uL (ref 150–400)
RBC: 5.78 MIL/uL (ref 4.22–5.81)
RDW: 12.4 % (ref 11.5–15.5)
WBC: 4.8 10*3/uL (ref 4.0–10.5)
nRBC: 0 % (ref 0.0–0.2)

## 2024-01-06 LAB — BASIC METABOLIC PANEL WITH GFR
Anion gap: 9 (ref 5–15)
BUN: 17 mg/dL (ref 6–20)
CO2: 22 mmol/L (ref 22–32)
Calcium: 9.1 mg/dL (ref 8.9–10.3)
Chloride: 107 mmol/L (ref 98–111)
Creatinine, Ser: 0.88 mg/dL (ref 0.61–1.24)
GFR, Estimated: >60 mL/min (ref >60)
Glucose, Bld: 97 mg/dL (ref 70–99)
Potassium: 4.4 mmol/L (ref 3.5–5.1)
Sodium: 138 mmol/L (ref 135–145)

## 2024-01-06 NOTE — Discharge Instructions (Signed)
 Your blood work was normal today.  You can take the medication for dizziness as needed. If this continues, please follow up with ENT whose information is attached.

## 2024-01-06 NOTE — ED Provider Notes (Signed)
 [facility] Provider Note    Event Date/Time   First MD Initiated Contact with Patient 01/06/24 1120     (approximate)   History   Off balance    HPI  Nathaniel Rowe is a 33 y.o. male with PMH of tympanostomy tube placement presents for evaluation of feeling off balance for a few days.  Patient states that 5 days ago he was attempting to do a front flip into the water, slipped and hit the water with the right side of his face.  After that he had some ringing in his ear that has gotten better.  Did not note any drainage or bleeding from the ear.  States that he feels a little off balance while walking.  Has taken some medication to help with dizziness which did not give him much improvement. No hearing changes, vision changes, speech changes or ear pain.      Physical Exam   Triage Vital Signs: ED Triage Vitals  Encounter Vitals Group     BP 01/06/24 1103 136/84     Girls Systolic BP Percentile --      Girls Diastolic BP Percentile --      Boys Systolic BP Percentile --      Boys Diastolic BP Percentile --      Pulse Rate 01/06/24 1103 91     Resp 01/06/24 1103 20     Temp 01/06/24 1103 98.5 F (36.9 C)     Temp Source 01/06/24 1103 Oral     SpO2 01/06/24 1103 96 %     Weight 01/06/24 1104 230 lb (104.3 kg)     Height 01/06/24 1104 6' 2 (1.88 m)     Head Circumference --      Peak Flow --      Pain Score 01/06/24 1104 0     Pain Loc --      Pain Education --      Exclude from Growth Chart --     Most recent vital signs: Vitals:   01/06/24 1103  BP: 136/84  Pulse: 91  Resp: 20  Temp: 98.5 F (36.9 C)  SpO2: 96%   General: Awake, no distress.  CV:  Good peripheral perfusion.  Resp:  Normal effort.  Abd:  No distention.  Other:  Bilateral TMs are translucent, light reflex is present, scarring from previous tympanostomy tube placement.  PERRL, EOM intact, no focal neurodeficits.   ED Results / Procedures / Treatments    Labs (all labs ordered are listed, but only abnormal results are displayed) Labs Reviewed  CBC - Abnormal; Notable for the following components:      Result Value   Hemoglobin 17.8 (*)    All other components within normal limits  BASIC METABOLIC PANEL WITH GFR    PROCEDURES:  Critical Care performed: No  Procedures   MEDICATIONS ORDERED IN ED: Medications - No data to display   IMPRESSION / MDM / ASSESSMENT AND PLAN / ED COURSE  I reviewed the triage vital signs and the nursing notes.                             33 year old male presents for evaluation of feeling off balance.  Vital signs are stable patient NAD on exam.  Differential diagnosis includes, but is not limited to, BPPV, barotrauma, tympanic membrane rupture, AOE, foreign body.  Patient's presentation is most consistent with acute complicated illness / injury  requiring diagnostic workup.  CBC shows slightly elevated hemoglobin otherwise unremarkable.  BMP WNL.  Based on patient's mechanism of injury was most suspicious for tympanic membrane rupture but did not see this on physical exam.  Very low suspicion for cerebellar stroke as dizziness is intermittent and patient did not have any focal deficits on neuroexam.  Patient's dizziness is intermittent and is associated with positional changes so I believe this is BPPV.  Discussed taking meclizine and patient states that he already has a similar medication so did not want a new prescription.  Recommended that he follow-up with ear nose and throat if this continues.  Patient voiced understanding, all questions were answered and he was stable at discharge.      FINAL CLINICAL IMPRESSION(S) / ED DIAGNOSES   Final diagnoses:  Benign paroxysmal vertigo, unspecified laterality     Rx / DC Orders   ED Discharge Orders     None        Note:  This document was prepared using Dragon voice recognition software and may include unintentional dictation errors.    Phyliss Breen, PA-C 01/06/24 1159    Kandee Orion, MD 01/06/24 [phone]

## 2024-01-06 NOTE — ED Triage Notes (Signed)
 Pt to ED via POV from home. Pt reports 5 days ago he was jumping into the water and landed on his ear. Pt reports seen has been feeling off balance with walking. Pt denies vision changes, speech changes or numbness/tingling. Pt denies pain.

## 2024-07-10 ENCOUNTER — Emergency Department: Payer: Self-pay

## 2024-07-10 ENCOUNTER — Other Ambulatory Visit: Payer: Self-pay

## 2024-07-10 ENCOUNTER — Emergency Department
Admission: EM | Admit: 2024-07-10 | Discharge: 2024-07-10 | Disposition: A | Discharge status: Home / Self Care | Payer: Self-pay | Attending: Emergency Medicine | Admitting: Emergency Medicine

## 2024-07-10 DIAGNOSIS — M545 Low back pain, unspecified: Secondary | ICD-10-CM | POA: Insufficient documentation

## 2024-07-10 DIAGNOSIS — R1030 Lower abdominal pain, unspecified: Secondary | ICD-10-CM | POA: Insufficient documentation

## 2024-07-10 DIAGNOSIS — N50811 Right testicular pain: Secondary | ICD-10-CM | POA: Insufficient documentation

## 2024-07-10 MED ORDER — OXYCODONE-ACETAMINOPHEN 5-325 MG PO TABS: 1.0000 | ORAL_TABLET | ORAL | 0 refills | Status: AC | PRN

## 2024-07-10 MED ORDER — MELOXICAM 15 MG PO TABS: 15.0000 mg | ORAL_TABLET | Freq: Every day | ORAL | 0 refills | Status: AC

## 2024-07-10 MED ORDER — CYCLOBENZAPRINE HCL 10 MG PO TABS: 10.0000 mg | ORAL_TABLET | Freq: Three times a day (TID) | ORAL | 0 refills | Status: AC | PRN

## 2024-07-10 MED ORDER — LIDOCAINE 5 % EX PTCH
1.0000 | MEDICATED_PATCH | CUTANEOUS | Status: DC
  Administered 2024-07-10: 1 via TRANSDERMAL
  Filled 2024-07-10: qty 1

## 2024-07-10 NOTE — ED Provider Notes (Signed)
 "  [facility] Provider Note   Event Date/Time   First MD Initiated Contact with Patient 07/10/24 1146     (approximate) History  Back Pain and Back Injury  HPI Nathaniel Rowe is a 33 y.o. male with no stated past medical history presents complaining of of midline low back pain that began last night after he took a misstep.  Patient has been using Motrin , Percocet, and Tylenol  overnight with minimal sleep.  Patient states that the pain is currently 7/10 without movement and 10/10 with movement.  Patient states that he has had intermittent right groin and testicular pain that he has had over the last 2 years.  Patient denies any shooting pain down either leg, saddle anesthesia, or bowel/bladder continence ROS: Patient currently denies any vision changes, tinnitus, difficulty speaking, facial droop, sore throat, chest pain, shortness of breath, abdominal pain, nausea/vomiting/diarrhea, dysuria   Physical Exam  Triage Vital Signs: ED Triage Vitals  Encounter Vitals Group     BP 07/10/24 0946 127/88     Girls Systolic BP Percentile --      Girls Diastolic BP Percentile --      Boys Systolic BP Percentile --      Boys Diastolic BP Percentile --      Pulse Rate 07/10/24 0946 62     Resp 07/10/24 0946 20     Temp 07/10/24 0946 97.8 F (36.6 C)     Temp Source 07/10/24 0946 Oral     SpO2 07/10/24 0946 96 %     Weight 07/10/24 0949 250 lb (113.4 kg)     Height 07/10/24 0949 6' 2 (1.88 m)     Head Circumference --      Peak Flow --      Pain Score 07/10/24 0947 6     Pain Loc --      Pain Education --      Exclude from Growth Chart --    Most recent vital signs: Vitals:   07/10/24 0946  BP: 127/88  Pulse: 62  Resp: 20  Temp: 97.8 F (36.6 C)  SpO2: 96%   General: Awake, oriented x4. CV:  Good peripheral perfusion. Resp:  Normal effort. Abd:  No distention. Other:  Middle-aged overweight Caucasian male resting comfortably in no acute distress.   Tenderness palpation over bilateral lower lumbar paraspinal musculature ED Results / Procedures / Treatments  Labs (all labs ordered are listed, but only abnormal results are displayed) Labs Reviewed - No data to display  RADIOLOGY ED MD interpretation: X-ray of the lumbar spine does not show any evidence of acute fracture or malalignment - All radiology independently interpreted and agree with radiology assessment Official radiology report(s): DG Lumbar Spine 2-3 Views Result Date: 07/10/2024 EXAM: 2 or 3 VIEW(S) XRAY OF THE LUMBAR SPINE 07/10/2024 10:22:00 AM COMPARISON: 03/18/2006 CLINICAL HISTORY: injury and severe pain FINDINGS: LUMBAR SPINE: BONES: 5 non rib bearing lumbar type vertebral bodies in anatomic alignment. Vertebral body heights are maintained. Alignment is normal. No spondylolisthesis. DISCS AND DEGENERATIVE CHANGES: Small osteophyte formation at L2-L3 and L3-L4. SOFT TISSUES: No acute abnormality. IMPRESSION: 1. No acute fracture or malalignment of the lumbar spine. Electronically signed by: Rogelia Myers MD 07/10/2024 10:39 AM EST RP Workstation: [id number]   PROCEDURES: Critical Care performed: No Procedures MEDICATIONS ORDERED IN ED: Medications  lidocaine  (LIDODERM ) 5 % 1 patch (1 patch Transdermal Patch Applied 07/10/24 1239)   IMPRESSION / MDM / ASSESSMENT AND PLAN / ED COURSE  I  reviewed the triage vital signs and the nursing notes.                             The patient is on the cardiac monitor to evaluate for evidence of arrhythmia and/or significant heart rate changes. Patient's presentation is most consistent with acute presentation with potential threat to life or bodily function. 33 year old male patient presents for low back pain. Given History and Exam the patient appears to be at low risk for Spinal Cord Compression Syndrome, Vertebral Malignancy/Mets, acute Spinal Fracture, Vertebral Osteomyelitis, Epidural Abscess, Infected or Obstructing Kidney  Stone.  Their presentation appears most likely to be secondary to non-emergent musculoskeletal etiology vs non-emergent disc herniation.  ED Workup: X-ray of the lumbar spine does not show any evidence of acute abnormalities  Disposition: Discharge. Strict return precautions discussed with patient with full understanding. Advised patient to follow up promptly with primary care provider   FINAL CLINICAL IMPRESSION(S) / ED DIAGNOSES   Final diagnoses:  Acute midline low back pain without sciatica   Rx / DC Orders   ED Discharge Orders          Ordered    oxyCODONE -acetaminophen  (PERCOCET) 5-325 MG tablet  Every 4 hours PRN        07/10/24 1235    cyclobenzaprine  (FLEXERIL ) 10 MG tablet  3 times daily PRN        07/10/24 1235    meloxicam  (MOBIC ) 15 MG tablet  Daily        07/10/24 1235           Note:  This document was prepared using Dragon voice recognition software and may include unintentional dictation errors.   Luccas Towell K, MD 07/10/24 1245  "

## 2024-07-10 NOTE — ED Triage Notes (Signed)
 To ED from home [facility] for back pain. Around 2pm last night took a mis step and immediately felt sharp pain to midline lumbar spine. Last night took 800mg  motrin , percocet and tylenol  and did not help with the pain. Could not stand up last night or today due to pain. Pt supine in recliner, 7/10 with no movement. Also intermittent R groin and testicle pain since 2 years. No hematuria or dysuria.   158/94, HR 76, 96% RA. No line, no meds.   Denies numbness or weakness to either leg. No new incontinence. Can lift leg about 12 inches before reproducing pain to back.
